# Patient Record
Sex: Female | Born: 1985 | Race: Black or African American | Hispanic: No | Marital: Single | Smoking: Never smoker
Health system: Southern US, Community
[De-identification: ages and names within clinical notes are randomized; demographics above are authoritative.]

## PROBLEM LIST (undated history)

## (undated) ENCOUNTER — Inpatient Hospital Stay (HOSPITAL_COMMUNITY): Payer: Self-pay

## (undated) DIAGNOSIS — N200 Calculus of kidney: Secondary | ICD-10-CM

## (undated) DIAGNOSIS — IMO0002 Reserved for concepts with insufficient information to code with codable children: Secondary | ICD-10-CM

## (undated) DIAGNOSIS — B009 Herpesviral infection, unspecified: Secondary | ICD-10-CM

## (undated) DIAGNOSIS — F419 Anxiety disorder, unspecified: Secondary | ICD-10-CM

---

## 2007-08-20 ENCOUNTER — Ambulatory Visit: Payer: Self-pay | Admitting: Obstetrics and Gynecology

## 2007-08-20 ENCOUNTER — Inpatient Hospital Stay (HOSPITAL_COMMUNITY): Admission: AD | Admit: 2007-08-20 | Discharge: 2007-08-24 | Payer: Self-pay | Admitting: Obstetrics & Gynecology

## 2008-02-20 ENCOUNTER — Ambulatory Visit: Payer: Self-pay | Admitting: Family Medicine

## 2008-05-04 ENCOUNTER — Ambulatory Visit: Payer: Self-pay | Admitting: Family Medicine

## 2009-12-06 ENCOUNTER — Ambulatory Visit: Payer: Self-pay | Admitting: Family Medicine

## 2010-10-16 ENCOUNTER — Inpatient Hospital Stay (HOSPITAL_COMMUNITY)
Admission: AD | Admit: 2010-10-16 | Discharge: 2010-10-16 | Payer: Self-pay | Source: Home / Self Care | Attending: Obstetrics & Gynecology | Admitting: Obstetrics & Gynecology

## 2011-01-17 LAB — URINALYSIS, ROUTINE W REFLEX MICROSCOPIC
Bilirubin Urine: NEGATIVE
Ketones, ur: 15 mg/dL — AB
Nitrite: NEGATIVE
Protein, ur: NEGATIVE mg/dL
pH: 6 (ref 5.0–8.0)

## 2011-01-17 LAB — WET PREP, GENITAL

## 2011-03-14 ENCOUNTER — Other Ambulatory Visit (HOSPITAL_COMMUNITY): Payer: Self-pay

## 2011-03-14 ENCOUNTER — Other Ambulatory Visit (HOSPITAL_COMMUNITY): Payer: Medicaid Other

## 2011-03-15 ENCOUNTER — Inpatient Hospital Stay (HOSPITAL_COMMUNITY)
Admission: AD | Admit: 2011-03-15 | Discharge: 2011-03-17 | DRG: 371 | Disposition: A | Discharge status: Home / Self Care | Payer: Federal, State, Local not specified - PPO | Source: Ambulatory Visit | Attending: Obstetrics and Gynecology | Admitting: Obstetrics and Gynecology

## 2011-03-15 DIAGNOSIS — Z21 Asymptomatic human immunodeficiency virus [HIV] infection status: Secondary | ICD-10-CM | POA: Diagnosis present

## 2011-03-15 DIAGNOSIS — Z22330 Carrier of Group B streptococcus: Secondary | ICD-10-CM

## 2011-03-15 DIAGNOSIS — O34219 Maternal care for unspecified type scar from previous cesarean delivery: Principal | ICD-10-CM | POA: Diagnosis present

## 2011-03-15 DIAGNOSIS — O98519 Other viral diseases complicating pregnancy, unspecified trimester: Secondary | ICD-10-CM | POA: Diagnosis present

## 2011-03-15 DIAGNOSIS — O99892 Other specified diseases and conditions complicating childbirth: Secondary | ICD-10-CM | POA: Diagnosis present

## 2011-03-15 LAB — CBC
HCT: 37.1 % (ref 36.0–46.0)
Hemoglobin: 12.3 g/dL (ref 12.0–15.0)
MCH: 27.9 pg (ref 26.0–34.0)
RBC: 4.41 MIL/uL (ref 3.87–5.11)

## 2011-03-15 LAB — RPR: RPR Ser Ql: NONREACTIVE

## 2011-03-16 LAB — CBC
HCT: 26 % — ABNORMAL LOW (ref 36.0–46.0)
Hemoglobin: 8.5 g/dL — ABNORMAL LOW (ref 12.0–15.0)
MCV: 85.2 fL (ref 78.0–100.0)
WBC: 10.3 10*3/uL (ref 4.0–10.5)

## 2011-03-16 NOTE — Op Note (Signed)
NAME:  Claudia Fitzgerald, Claudia Fitzgerald               ACCOUNT NO.:  [id number]  MEDICAL RECORD NO.:  [id number]           PATIENT TYPE:  I  LOCATION:  9106                          [facility]  PHYSICIAN:  Gerrit Friends. Aldona Bar, M.D.   DATE OF BIRTH:  1985/12/04  DATE OF PROCEDURE:  03/15/2011 DATE OF DISCHARGE:                              OPERATIVE REPORT   PREOPERATIVE DIAGNOSES:  38-39 week intrauterine pregnancy, previous cesarean section, desire for repeat cesarean section, spontaneous rupture of membranes.  POSTOPERATIVE DIAGNOSES:  38-39 week intrauterine pregnancy, previous cesarean section, desire for repeat cesarean section, spontaneous rupture of membranes, delivery of a 6-pound-8-ounce female infant, Apgars 9 and 9.  PROCEDURE:  Low-transverse cesarean section, repeat.  SURGEON:  Gerrit Friends. Aldona Bar, MD  ANESTHESIA:  Spinal, Dr. Arby Barrette.  HISTORY:  This 25 year old, gravida 4, para 1 presented to Maternity Admissions at approximately 6:15 a.m. on Mar 15, 2011, with spontaneous rupture of membranes occurring at approximately 5:45 a.m.  I was called a little after 9 o'clock and made aware of the patient's presence. Examination shortly thereafter found the patient to indeed be ruptured, fetal heart tones were reactive and OR was called and the patient was scheduled for a repeat cesarean section.  The patient's group B strep status was positive.  Two grams of ampicillin IV were given preoperatively.  The patient was taken to the operating room where spinal anesthetic was carried out by Dr. Arby Barrette.  Thereafter, the patient was prepped and draped having placed in supine position and slightly tilted to left and a Foley catheter was placed as part of the prep.  At this time, good anesthetic levels were documented and thereafter procedure was begun.  A Pfannenstiel incision was made with minimal difficulty and dissected down sharply to and through the fascia in a low transverse fashion with  hemostasis created at each layer.  Subfascial space was created inferiorly and superiorly.  Muscle was separated in the midline.  Peritoneum was identified and entered appropriately with care taken to avoid the bowel superiorly and bladder inferiorly.  At this time, the bladder blade was placed and the vesicouterine peritoneum was incised in a low transverse fashion and thereafter the uterus was incised in a low transverse fashion as well using Metzenbaum scissors. A fair amount of bleeding was encountered from vessels in the myometrium.  Nonetheless, the cavity was entered.  Membranes were exposed.  Incision was extended laterally and thereafter with the aid of the vacuum extractor a viable female infant which weighed 6 pounds and 8 ounces was delivered from vertex position.  Apgar were 9 and 9.  After the cord was clamped and cut, the infant was passed off the awaiting team and thereafter taken to the Regular Nursery.  Weight was found to be 6 pounds and 8 ounces.  After the cord bloods was collected, the placenta was delivered intact. The placenta was anterior, but above the incision.  At this time, the uterus was exteriorized, rendered free of any remaining products of conception.  Closure of the uterine incision was then carried out using a single layer of #1 Vicryl in a  running locking fashion.  This created excellent hemostasis.  Tubes and ovaries were noted to be normal. Uterus was well contracted.  Again, the uterine incision was inspected and noted to be dry.  At this time, abdomen was lavaged of all free blood and clot, and the uterus was replaced into the abdominal cavity.  All counts were noted to be correct at this time.  There were some omental adhesion stuck to the anterior peritoneum on the patient's right and these were taken down sharply without difficulty.  Uterine incision was again inspected and noted to be dry and at this time closure of the abdomen was carried  out in layers.  The abdominal peritoneum was closed with 0 Vicryl in a running fashion and muscle was secured with same.  Subfascial space was rendered hemostatic and the fascia was then closed with 0 Vicryl from angle to midline bilaterally.  Subcutaneous tissue was rendered hemostatic and staples were then used to close the skin.  A sterile pressure dressing was applied and the patient at this time was transported to the recovery room in satisfactory condition having tolerated the procedure well.  Estimated blood loss 600 mL.  All counts were correct x2.  In summary, this patient presented to Maternity Admissions with spontaneous rupture of membranes.  She was 38-39 weeks' gestation having had a previous cesarean section and desires repeat.  She was scheduled for next week, but because of ruptured membranes and because of her group B strep status she received a dose of 2 g of ampicillin preop and was taken to the operating room for repeat low transverse cesarean section with delivery of a viable female infant weighing 6 pounds and 8 ounces and Apgars 9 and 9.  At the conclusion of the procedure, both mother and baby were doing well in their respective recovery areas.     Gerrit Friends. Aldona Bar, M.D.     RMW/MEDQ  D:  03/15/2011  T:  03/16/2011  Job:  [id number]  Electronically Signed by Annamaria Helling M.D. on 03/16/2011 05:36:56 AM

## 2011-03-21 NOTE — Op Note (Signed)
NAME:  Fitzgerald, Claudia               ACCOUNT NO.:  [id number]   MEDICAL RECORD NO.:  [id number]          PATIENT TYPE:  INP   LOCATION:  9167                          [facility]   PHYSICIAN:  Ilda Mori, M.D.   DATE OF BIRTH:  1986/06/21   DATE OF PROCEDURE:  08/21/2007  DATE OF DISCHARGE:                               OPERATIVE REPORT   PREOPERATIVE DIAGNOSIS:  Second stage arrest, cephalopelvic  disproportion.   POSTOPERATIVE DIAGNOSIS:  Second stage arrest, cephalopelvic  disproportion.   PROCEDURE:  Primary low transverse cesarean section.   SURGEON:  Dr. Ilda Mori.   ASSISTANT:  Dr. Christin Bach.   ANESTHESIA:  Epidural.   ESTIMATED BLOOD LOSS:  800 mL.   FINDINGS:  Female infant, Apgar scores 9 and 9, birth weight 8 pounds 7  ounces.  Normal-appearing tubes and ovaries.   COMPLICATIONS:  None.   INDICATIONS:  This is a 25 year old primigravid female who was admitted  on the evening of 10/14 for prostaglandin ripening of the cervix and  started on Pitocin the morning of 10/15 for induction of labor.  The  patient was 59 weeks' gestation with an estimated fetal weight between 8-  1/2 and 9 pounds.  The patient's pelvis appeared adequate but she was  only 4 feet 8 inches tall.  The patient proceeded through the first  stage of labor rapidly and was completely dilated by 1330.  She was  allowed to labor down until 1410 and pushed until 1610.  At that point  the vertex had not made significant descent.  It was still at 0 to +1  station.  Because of concerns over the size of the baby and the possible  inadequacy of the pelvis, decision was made not to proceed with  operative delivery but to proceed with cesarean section.  The patient  was in concurrence with this recommendation.   PROCEDURE:  The patient is taken to the operating room and the epidural  anesthesia that had been placed during labor was injected for surgical  anesthesia.  The bladder had been  catheterized previously.  The abdomen  was prepped and draped in sterile fashion.  A low transverse incision  was made and carried down to the fascia which was extended transversely.  The anterior rectus sheath was grasped with Kocher clamps dissected from  the underlying rectus muscle.  The rectus midline was identified and  opened the peritoneum was identified and opened.  The incision was then  extended bluntly.  The lower segment was identified.  Incision was made  in the lower segment and extended bluntly.  The infant's head was deep  in the pelvis but was delivered without difficulty.  The cord bloods  were obtained and the placenta was then delivered with traction on the  cord.  The membranes were cleared with blunt curettage. The lower  segment closed with a 0 Vicryl running interlocking suture.  A second  imbricating layer of 0 Vicryl suture was also performed.  Hemostasis was  then noted to be present.  The peritoneum was closed with  running 0 Vicryl  and 3-0 Vicryl suture and the rectus muscle was brought  together in the midline with 3-0 Vicryl suture.  The fascia was closed  with 0 Vicryl suture and the skin was closed with staples.  The patient  tolerated procedure well and left the operating room condition.      Ilda Mori, M.D.  Electronically Signed     RK/MEDQ  D:  08/21/2007  T:  08/22/2007  Job:  [id number]

## 2011-03-22 ENCOUNTER — Inpatient Hospital Stay (HOSPITAL_COMMUNITY)
Admission: RE | Admit: 2011-03-22 | Payer: Federal, State, Local not specified - PPO | Source: Ambulatory Visit | Admitting: Obstetrics and Gynecology

## 2011-03-24 NOTE — Discharge Summary (Signed)
NAME:  Claudia Fitzgerald, Claudia Fitzgerald               ACCOUNT NO.:  [id number]   MEDICAL RECORD NO.:  [id number]          PATIENT TYPE:  INP   LOCATION:  9107                          [facility]   PHYSICIAN:  Kendra H. Tenny Craw, MD     DATE OF BIRTH:  01-Nov-1986   DATE OF ADMISSION:  08/20/2007  DATE OF DISCHARGE:  08/24/2007                               DISCHARGE SUMMARY   FINAL DIAGNOSES:  1. Intrauterine gestation at 40-6/7 weeks.  2. Induction of labor.  3. 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  4. Arrest of the second stage of labor.  5. Cephalopelvic disproportion.   PROCEDURE:  Primary low transverse cesarean section.  Surgeon:  Teena Irani.  Arlyce Dice, M.D.  Assistant:  Tilda Burrow, M.D.   COMPLICATIONS:  None.   This 25 year old G1, P0, was admitted at 40-6/7 weeks' gestation for  induction secondary to post dates pregnancy.  The patient's antepartum  course up to this point has been complicated by a history of HSV.  She  did have a recent outbreak of HSV but no lesions present at this time.  The patient was admitted on the evening of the 14th for Cytotec and was  started on Pitocin on the morning of the 15th to augment labor.  The  patient proceeded through the first stage of labor rapidly.  At this  point the patient pushed for over 2 hours and the vertex did not make  any significant descent.  Because of the possible size of the baby and  the inadequacy of the pelvis, a diagnosis of cephalopelvic disproportion  was made and a decision was made to proceed with a cesarean section.  The patient was taken to the operating room on August 21, 2007, by Dr.  Ilda Mori, where a primary low transverse cesarean section was  performed with the delivery of an 8 pound 7 ounce female infant with  Apgars of 9 and 9.  Delivery went without complications.  The patient's  postoperative course was benign without any significant fevers.  The  patient was felt ready  for discharge on postoperative day #3.  She did  want her little boy circumcised before discharge.  She was sent home on  a regular diet, told to decrease activities, told to continue her  prenatal vitamins, was given Percocet one to two every 4-6 hours as  needed for the pain, was to follow up in our office in 4-6 weeks.  Instructions and precautions were reviewed with the patient.  Labs on  discharge:  The patient had a hemoglobin of 11.8, white blood cell count  of 11.7, platelets of 137,000.      Leilani Able, P.A.-C.      Freddrick March. Tenny Craw, MD  Electronically Signed   MB/MEDQ  D:  09/26/2007  T:  09/26/2007  Job:  [id number]

## 2011-03-25 NOTE — Discharge Summary (Signed)
  NAME:  Claudia Fitzgerald, Claudia Fitzgerald               ACCOUNT NO.:  [id number]  MEDICAL RECORD NO.:  [id number]           PATIENT TYPE:  I  LOCATION:  9106                          [facility]  PHYSICIAN:  Malva Limes, M.D.    DATE OF BIRTH:  05-18-1986  DATE OF ADMISSION:  03/15/2011 DATE OF DISCHARGE:  03/17/2011                              DISCHARGE SUMMARY   PRINCIPAL DISCHARGE DIAGNOSES: 1. Intrauterine pregnancy at term. 2. Spontaneous rupture of membranes. 3. History of previous cesarean section. 4. The patient desires repeat cesarean section. 5. History of herpes simplex virus. 6. Positive group B strep status.  PRICIPAL PROCEDURE:  Repeat low transverse cesarean section.  HISTORY OF PRESENT ILLNESS:  Ms. Claudia Fitzgerald is a 25 year old black female, G4, [facility], [facility] Mar 26, 2010 who presented to [facility] complaining of ruptured membranes.  The patient was seen by Dr. Aldona Bar. She had previously had a discussion about repeat cesarean section and desired that procedure.  The patient underwent repeat cesarean section. A complete description of this can be found in dictated operative note. The patient's postop course was benign at the time of discharge.  She was eating a regular diet.  She was ambulating without difficulty.  She did have flatus.  Her incision appeared to be healing well.  The patient's preop hemoglobin was 12.3, postop 8.5.  The patient expressed a strong desire to be discharged on postop day #2.  She was discharged to home.  She was instructed to follow up in the office in 4 weeks.  She was sent home with Percocet to take p.r.n.  She was told to call the office with any fever, severe pain, or heavy bleeding.          ______________________________ Malva Limes, M.D.     MA/MEDQ  D:  03/17/2011  T:  03/17/2011  Job:  [id number]  Electronically Signed by Malva Limes M.D. on 03/25/2011 04:00:09 PM

## 2011-08-16 LAB — CBC
Hemoglobin: 11.8 — ABNORMAL LOW
MCHC: 33.2
MCV: 87.6
RBC: 4.06
RDW: 15.1 — ABNORMAL HIGH

## 2011-08-17 LAB — CBC
MCHC: 34.5
MCV: 85.7
Platelets: 158
RDW: 15.2 — ABNORMAL HIGH

## 2011-08-17 LAB — RPR: RPR Ser Ql: NONREACTIVE

## 2012-01-30 ENCOUNTER — Encounter (HOSPITAL_COMMUNITY): Payer: Self-pay | Admitting: *Deleted

## 2012-01-30 ENCOUNTER — Emergency Department (HOSPITAL_COMMUNITY)
Admission: EM | Admit: 2012-01-30 | Discharge: 2012-01-30 | Disposition: A | Discharge status: Home / Self Care | Payer: Federal, State, Local not specified - PPO | Attending: Emergency Medicine | Admitting: Emergency Medicine

## 2012-01-30 ENCOUNTER — Emergency Department (HOSPITAL_COMMUNITY): Payer: Federal, State, Local not specified - PPO

## 2012-01-30 ENCOUNTER — Other Ambulatory Visit: Payer: Self-pay

## 2012-01-30 DIAGNOSIS — R079 Chest pain, unspecified: Secondary | ICD-10-CM | POA: Insufficient documentation

## 2012-01-30 DIAGNOSIS — F411 Generalized anxiety disorder: Secondary | ICD-10-CM | POA: Insufficient documentation

## 2012-01-30 HISTORY — DX: Anxiety disorder, unspecified: F41.9

## 2012-01-30 LAB — CBC
MCHC: 34.6 g/dL (ref 30.0–36.0)
Platelets: 244 10*3/uL (ref 150–400)
RDW: 13.3 % (ref 11.5–15.5)

## 2012-01-30 LAB — BASIC METABOLIC PANEL
BUN: 11 mg/dL (ref 6–23)
GFR calc Af Amer: >90 mL/min (ref >90)
GFR calc non Af Amer: >90 mL/min (ref >90)
Potassium: 3.1 mEq/L — ABNORMAL LOW (ref 3.5–5.1)
Sodium: 137 mEq/L (ref 135–145)

## 2012-01-30 LAB — TROPONIN I: Troponin I: <0.3 ng/mL (ref <0.30)

## 2012-01-30 LAB — D-DIMER, QUANTITATIVE: D-Dimer, Quant: <0.22 ug/mL-FEU (ref 0.00–0.48)

## 2012-01-30 MED ORDER — OXYCODONE-ACETAMINOPHEN 5-325 MG PO TABS
1.0000 | ORAL_TABLET | Freq: Once | ORAL | Status: AC
  Administered 2012-01-30: 1 via ORAL
  Filled 2012-01-30: qty 1

## 2012-01-30 MED ORDER — POTASSIUM CHLORIDE 20 MEQ/15ML (10%) PO LIQD
40.0000 meq | Freq: Once | ORAL | Status: AC
  Administered 2012-01-30: 40 meq via ORAL
  Filled 2012-01-30: qty 30

## 2012-01-30 MED ORDER — IBUPROFEN 600 MG PO TABS: 600.0000 mg | ORAL_TABLET | Freq: Four times a day (QID) | ORAL | Status: AC | PRN

## 2012-01-30 NOTE — ED Provider Notes (Signed)
History     CSN: [id number]  Arrival date & time 01/30/12  0122   First MD Initiated Contact with Patient 01/30/12 [phone]      Chief Complaint  Patient presents with  . Chest Pain    (Consider location/radiation/quality/duration/timing/severity/associated sxs/prior treatment) HPI  25yoF h/o anxiety pw chest pain. She is complaining of 6/10 substernal chest pain. She describes it as sharp and radiating to her back. Pleurisy. Denies fevers, chills, cough. She states the pain has been constant since 2 days ago. She states that she did have shortness of breath, palpitations, syncope at that time. She was seen at an outside emergency department and diagnosed with anxiety. She states that it does seem different from her normal anxiety. Denies h/o VTE in self or family. No recent hosp/surg/immob. No h/o cancer. Denies exogenous hormone use, no leg pain or swelling   ED Notes, ED Provider Notes from 01/30/12 0000 to 01/30/12 02:36:26       Threasa Alpha Flueckiger, RN 01/30/2012 01:31      Pt has been having CP since yesterday. She was seen at [facility] yesterday CP and anxiety attack. Pt states that she was discharged home but continues to have stabbing type chest pain in her mid chest, radiating to her back. No sob, n/v or diaphoresis with this. Pt denies any anxiety at this time and appears calm in triage      Past Medical History  Diagnosis Date  . Anxiety     Past Surgical History  Procedure Date  . Cesarean section 2008 and 2012    No family history on file.  History  Substance Use Topics  . Smoking status: Never Smoker   . Smokeless tobacco: Not on file  . Alcohol Use: Yes    OB History    Grav Para Term Preterm Abortions TAB SAB Ect Mult Living                  Review of Systems  All other systems reviewed and are negative.   except as noted HPI   Allergies  Review of patient's allergies indicates no known allergies.  Home Medications   Current  Outpatient Rx  Name Route Sig Dispense Refill  . ASPIRIN 325 MG PO TABS Oral Take 650 mg by mouth daily as needed. For pain.    . IBUPROFEN 600 MG PO TABS Oral Take 1 tablet (600 mg total) by mouth every 6 (six) hours as needed for pain. 30 tablet 0    BP 116/91  Pulse 78  Temp(Src) 97.9 F (36.6 C) (Oral)  Resp 20  SpO2 100%  LMP 01/16/2012  Physical Exam  Nursing note and vitals reviewed. Constitutional: She is oriented to person, place, and time. She appears well-developed.  HENT:  Head: Atraumatic.  Mouth/Throat: Oropharynx is clear and moist.  Eyes: Conjunctivae and EOM are normal. Pupils are equal, round, and reactive to light.  Neck: Normal range of motion. Neck supple.  Cardiovascular: Normal rate, regular rhythm, normal heart sounds and intact distal pulses.   Pulmonary/Chest: Effort normal and breath sounds normal. No respiratory distress. She has no wheezes. She has no rales. She exhibits tenderness.       +cw ttp- states does not reproduce her pain  Abdominal: Soft. She exhibits no distension. There is no tenderness. There is no rebound and no guarding.  Musculoskeletal: Normal range of motion.  Neurological: She is alert and oriented to person, place, and time.  Skin: Skin is warm and  dry. No rash noted.  Psychiatric: She has a normal mood and affect.    Date: 01/30/2012  Rate: 90  Rhythm: normal sinus rhythm  QRS Axis: normal  Intervals: normal  ST/T Wave abnormalities: normal  Conduction Disutrbances:none  Narrative Interpretation:   Old EKG Reviewed: none available    ED Course  Procedures (including critical care time)  Labs Reviewed  BASIC METABOLIC PANEL - Abnormal; Notable for the following:    Potassium 3.1 (*)    All other components within normal limits  CBC  TROPONIN I  D-DIMER, QUANTITATIVE   Dg Chest 2 View  01/30/2012  *RADIOLOGY REPORT*  Clinical Data: Mid chest pain.  CHEST - 2 VIEW  Comparison: Chest radiograph performed 01/28/2012   Findings: The lungs are well-aerated and clear.  There is no evidence of focal opacification, pleural effusion or pneumothorax.  The heart is normal in size; the mediastinal contour is within normal limits.  No acute osseous abnormalities are seen.  IMPRESSION: No acute cardiopulmonary process seen.  Original Report Authenticated By: Tonia Ghent, M.D.    1. Chest pain     MDM  PW chest pain, likely 2/2 msk. Anxiety may play a role. EKG, labs unremarkable including d dimer. Feeling better with percocet x 1. Will discharge home with ibuprofen, PMD f/u.        Forbes Cellar, MD 01/30/12 [phone]

## 2012-01-30 NOTE — ED Notes (Signed)
Pt states that she is having a constant sharp pain that radiates through her back and gets worse with a deep breath. Pt denies N/V/D, SOB,or diaphoresis. Pt alert and oriented able to follow commands and move extremities.

## 2012-01-30 NOTE — Discharge Instructions (Signed)
Chest Pain (Nonspecific) It is often hard to give a specific diagnosis for the cause of chest pain. There is always a chance that your pain could be related to something serious, such as a heart attack or a blood clot in the lungs. You need to follow up with your caregiver for further evaluation. CAUSES   Heartburn.   Pneumonia or bronchitis.   Anxiety or stress.   Inflammation around your heart (pericarditis) or lung (pleuritis or pleurisy).   A blood clot in the lung.   A collapsed lung (pneumothorax). It can develop suddenly on its own (spontaneous pneumothorax) or from injury (trauma) to the chest.   Shingles infection (herpes zoster virus).  The chest wall is composed of bones, muscles, and cartilage. Any of these can be the source of the pain.  The bones can be bruised by injury.   The muscles or cartilage can be strained by coughing or overwork.   The cartilage can be affected by inflammation and become sore (costochondritis).  DIAGNOSIS  Lab tests or other studies, such as X-rays, electrocardiography, stress testing, or cardiac imaging, may be needed to find the cause of your pain.  TREATMENT   Treatment depends on what may be causing your chest pain. Treatment may include:   Acid blockers for heartburn.   Anti-inflammatory medicine.   Pain medicine for inflammatory conditions.   Antibiotics if an infection is present.   You may be advised to change lifestyle habits. This includes stopping smoking and avoiding alcohol, caffeine, and chocolate.   You may be advised to keep your head raised (elevated) when sleeping. This reduces the chance of acid going backward from your stomach into your esophagus.   Most of the time, nonspecific chest pain will improve within 2 to 3 days with rest and mild pain medicine.  HOME CARE INSTRUCTIONS   If antibiotics were prescribed, take your antibiotics as directed. Finish them even if you start to feel better.   For the next few  days, avoid physical activities that bring on chest pain. Continue physical activities as directed.   Do not smoke.   Avoid drinking alcohol.   Only take over-the-counter or prescription medicine for pain, discomfort, or fever as directed by your caregiver.   Follow your caregiver's suggestions for further testing if your chest pain does not go away.   Keep any follow-up appointments you made. If you do not go to an appointment, you could develop lasting (chronic) problems with pain. If there is any problem keeping an appointment, you must call to reschedule.  SEEK MEDICAL CARE IF:   You think you are having problems from the medicine you are taking. Read your medicine instructions carefully.   Your chest pain does not go away, even after treatment.   You develop a rash with blisters on your chest.  SEEK IMMEDIATE MEDICAL CARE IF:   You have increased chest pain or pain that spreads to your arm, neck, jaw, back, or abdomen.   You develop shortness of breath, an increasing cough, or you are coughing up blood.   You have severe back or abdominal pain, feel nauseous, or vomit.   You develop severe weakness, fainting, or chills.   You have a fever.  THIS IS AN EMERGENCY. Do not wait to see if the pain will go away. Get medical help at once. Call your local emergency services (911 in [place] Do not drive yourself to the hospital. MAKE SURE YOU:   Understand these instructions.     Will watch your condition.   Will get help right away if you are not doing well or get worse.  Document Released: 08/02/2005 Document Revised: 10/12/2011 Document Reviewed: 05/28/2008 [facility] Patient Information 2012 [place], [place].  RESOURCE GUIDE  Dental Problems  Patients with Medicaid: [facility] [phone] [address]                                           [phone] [address] Phone:  [phone]                                                    Phone:  [phone]  If unable to pay or uninsured, contact:  [facility] or [facility]. to become qualified for the adult dental clinic.  Chronic Pain Problems Contact [facility]  [phone] Patients need to be referred by their primary care doctor.  Insufficient Money for Medicine Contact United Way:  call "211" or [facility] [phone].  No Primary Care Doctor Call [facility]  4038064432 Other agencies that provide inexpensive medical care    [facility]  [phone]    [facility]  [phone]    [facility]  [phone]    [facility]  [phone]    [facility]  [phone]    [facility]  [phone]  Psychological Services [facility]  [phone] [facility]  [phone] [facility]   [phone] (emergency services [phone])  Abuse/Neglect [facility] Child Abuse Hotline ([phone] [facility] Child Abuse Hotline 858-416-7057 (After Hours)  Emergency Shelter [facility] (863)691-6708  M[facility] [place] [facility] [phone])[phone] [facility] [phone]  MRSA Hotline #:   567-253-3728    [facility] Resources  [facility]  United [employer]                           [facility] Dept. [address] [place]                     [address]         [address]  [place]                                               Cristobal Goldmann Phone:  [phone]                                  Phone:  [phone]                   Phone:  [phone]  [facility] Phone:  [phone]  [facility] Child Abuse Hotline 747-727-2056 [phone]) [phone] (After Hours)

## 2012-01-30 NOTE — ED Notes (Signed)
Pt has been having CP since yesterday.  She was seen at [facility] yesterday CP and anxiety attack.  Pt states that she was discharged home but continues to have stabbing type chest pain in her mid chest, radiating to her back.  No sob, n/v or diaphoresis with this.  Pt denies any anxiety at this time and appears calm in triage

## 2012-11-19 LAB — OB RESULTS CONSOLE GC/CHLAMYDIA
Chlamydia: NEGATIVE
Gonorrhea: NEGATIVE

## 2013-01-22 LAB — OB RESULTS CONSOLE ABO/RH: RH Type: POSITIVE

## 2013-04-06 ENCOUNTER — Inpatient Hospital Stay (HOSPITAL_COMMUNITY)
Admission: AD | Admit: 2013-04-06 | Discharge: 2013-04-06 | Disposition: A | Discharge status: Home / Self Care | Payer: Federal, State, Local not specified - PPO | Source: Ambulatory Visit | Attending: Obstetrics and Gynecology | Admitting: Obstetrics and Gynecology

## 2013-04-06 ENCOUNTER — Inpatient Hospital Stay (HOSPITAL_COMMUNITY): Payer: Federal, State, Local not specified - PPO

## 2013-04-06 ENCOUNTER — Encounter (HOSPITAL_COMMUNITY): Payer: Self-pay | Admitting: *Deleted

## 2013-04-06 DIAGNOSIS — R109 Unspecified abdominal pain: Secondary | ICD-10-CM | POA: Insufficient documentation

## 2013-04-06 DIAGNOSIS — N949 Unspecified condition associated with female genital organs and menstrual cycle: Secondary | ICD-10-CM

## 2013-04-06 DIAGNOSIS — O34219 Maternal care for unspecified type scar from previous cesarean delivery: Secondary | ICD-10-CM | POA: Insufficient documentation

## 2013-04-06 DIAGNOSIS — O99891 Other specified diseases and conditions complicating pregnancy: Secondary | ICD-10-CM | POA: Insufficient documentation

## 2013-04-06 HISTORY — DX: Herpesviral infection, unspecified: B00.9

## 2013-04-06 LAB — URINALYSIS, ROUTINE W REFLEX MICROSCOPIC
Glucose, UA: 100 mg/dL — AB
Ketones, ur: NEGATIVE mg/dL
Leukocytes, UA: NEGATIVE
Protein, ur: NEGATIVE mg/dL

## 2013-04-06 MED ORDER — PRENATAL CA CARB-B6-B12-FA 1 MG PO TABS: 1.0000 | ORAL_TABLET | Freq: Every day | ORAL | Status: DC

## 2013-04-06 NOTE — MAU Note (Signed)
Saw Dr Dareen Piano on Thursday, having pain in incision site. Was told to call back on Fri, didn't but came in because the pain has become unbearable. Was told they may need to do an [place].

## 2013-04-06 NOTE — MAU Provider Note (Signed)
None     Chief Complaint:  Abdominal Pain   Claudia Fitzgerald is  27 y.o. [id number] at [redacted]w[redacted]d presents complaining of Abdominal Pain .  Pt  C/o lower abdominal pain when she moves, changes position, or the baby moves.  She states that it feels like it is her c/s scar, but the pain extends along the sides of her lower abdomen.    States that she has been having this pain since last week, and that the nurse at her OB's office said that Dr. Dareen Piano may want her to come to the MAU for an ultrasound.    Also states that Dr. Dareen Piano told her she could probably have her c/s June 25 instead of July 1 because of this pain.     Obstetrical/Gynecological History: Menstrual History: OB History   Grav Para Term Preterm Abortions TAB SAB Ect Mult Living   4 2 1 1 1 1  0 0 0 2       Past Medical History: Past Medical History  Diagnosis Date  . Anxiety   . Herpes     Past Surgical History: Past Surgical History  Procedure Laterality Date  . Cesarean section  2008 and 2012    Family History: History reviewed. No pertinent family history.  Social History: History  Substance Use Topics  . Smoking status: Never Smoker   . Smokeless tobacco: Not on file  . Alcohol Use: Yes    Allergies: No Known Allergies  Meds:  Prescriptions prior to admission  Medication Sig Dispense Refill  . valACYclovir (VALTREX) 500 MG tablet Take 500 mg by mouth 2 (two) times daily.        Review of Systems   Constitutional: Negative for fever and chills Eyes: Negative for visual disturbances Respiratory: Negative for shortness of breath, dyspnea Cardiovascular: Negative for chest pain or palpitations  Gastrointestinal: Negative for vomiting, diarrhea and constipation Genitourinary: Negative for dysuria and urgency Musculoskeletal: Negative for back pain, joint pain, myalgias  Neurological: Negative for dizziness and headaches     Physical Exam  Blood pressure 128/67, pulse 89, temperature 96.7 F  (35.9 C), temperature source Oral, resp. rate 18. GENERAL: Well-developed, well-nourished female in no acute distress.  LUNGS: Clear to auscultation bilaterally.  HEART: Regular rate and rhythm. ABDOMEN: Soft, nontender, nondistended, gravid.  EXTREMITIES: Nontender, no edema, 2+ distal pulses. CERVICAL EXAM: Dilatation 0cm   Effacement 0%   Station -3   Presentation: cephalic FHT:  Baseline rate 140 bpm   Variability moderate  Accelerations present   Decelerations none Contractions: Every 0 mins  Dr. Dareen Piano contacted and ordered an U/S to r/o separation of c/s scar.  He stated that C/S would not be done before 39 weeks if pregnancy remained normal.   Labs: Results for orders placed during the hospital encounter of 04/06/13 (from the past 24 hour(s))  URINALYSIS, ROUTINE W REFLEX MICROSCOPIC   Collection Time    04/06/13  1:49 PM      Result Value Range   Color, Urine YELLOW  YELLOW   APPearance CLEAR  CLEAR   Specific Gravity, Urine 1.010  1.005 - 1.030   pH 6.0  5.0 - 8.0   Glucose, UA 100 (*) NEGATIVE mg/dL   Hgb urine dipstick NEGATIVE  NEGATIVE   Bilirubin Urine NEGATIVE  NEGATIVE   Ketones, ur NEGATIVE  NEGATIVE mg/dL   Protein, ur NEGATIVE  NEGATIVE mg/dL   Urobilinogen, UA 0.2  0.0 - 1.0 mg/dL   Nitrite NEGATIVE  NEGATIVE  Leukocytes, UA NEGATIVE  NEGATIVE   Imaging Studies:  *RADIOLOGY REPORT*   Clinical Data:  Pain at location of cesarean section.  Rule out "separation."  34 weeks 5 days pregnant by last menstrual period.   LIMITED OBSTETRIC ULTRASOUND   Number of Fetuses: 1 Heart Rate: 143bpm Movement:  Present Presentation: Cephalic Placental Location: Anterior Previa: No Amniotic Fluid (Subjective): Normal Vertical pocket:  cm   AFI: 16.4cm (5%ile  6.9cm, 95%ile  24.9cm)   BPD:  8.4cm   34w    0d   MATERNAL FINDINGS: Cervix:  Not visualized Uterus/Adnexae:  Within normal limits.   IMPRESSION: Intrauterine pregnancy of 34 weeks zero days' with  fetal heart rate of 143 beats per minute.   No acute complication, including at the C section scar identified.     Original Report Authenticated By: Jeronimo Greaves, M.D.    Assessment: Claudia Fitzgerald is  27 y.o. [phone] at [redacted]w[redacted]d presents with round ligament pain.  Plan: Comfort measures discussed.    Fitzgerald,Claudia Harriman 6/1/20144:18 PM

## 2013-04-17 ENCOUNTER — Other Ambulatory Visit: Payer: Self-pay | Admitting: Obstetrics and Gynecology

## 2013-04-28 ENCOUNTER — Encounter (HOSPITAL_COMMUNITY): Payer: Self-pay | Admitting: *Deleted

## 2013-04-28 ENCOUNTER — Inpatient Hospital Stay (HOSPITAL_COMMUNITY)
Admission: AD | Admit: 2013-04-28 | Discharge: 2013-04-28 | Disposition: A | Discharge status: Home / Self Care | Payer: Federal, State, Local not specified - PPO | Source: Ambulatory Visit | Attending: Obstetrics and Gynecology | Admitting: Obstetrics and Gynecology

## 2013-04-28 DIAGNOSIS — O479 False labor, unspecified: Secondary | ICD-10-CM | POA: Diagnosis present

## 2013-04-28 DIAGNOSIS — O34219 Maternal care for unspecified type scar from previous cesarean delivery: Secondary | ICD-10-CM | POA: Insufficient documentation

## 2013-04-28 NOTE — MAU Note (Signed)
Patient states she had a small amount of leaking clear fluid at 0730, none since. Having irregular contractions. Reports good fetal movement.

## 2013-04-28 NOTE — MAU Note (Signed)
Patient is scheduled for a repeat cesarean section on 7-3.

## 2013-05-02 ENCOUNTER — Other Ambulatory Visit: Payer: Self-pay | Admitting: Obstetrics and Gynecology

## 2013-05-05 NOTE — Pre-Procedure Instructions (Signed)
Left message with stacey or scheduler at office to have [facility] preop orders for PAT appt tomorrow

## 2013-05-06 ENCOUNTER — Encounter (HOSPITAL_COMMUNITY)
Admission: RE | Admit: 2013-05-06 | Discharge: 2013-05-06 | Disposition: A | Discharge status: Home / Self Care | Payer: Federal, State, Local not specified - PPO | Source: Ambulatory Visit | Attending: Obstetrics and Gynecology | Admitting: Obstetrics and Gynecology

## 2013-05-06 ENCOUNTER — Encounter (HOSPITAL_COMMUNITY): Payer: Self-pay

## 2013-05-06 HISTORY — DX: Reserved for concepts with insufficient information to code with codable children: IMO0002

## 2013-05-06 LAB — CBC
Hemoglobin: 10.6 g/dL — ABNORMAL LOW (ref 12.0–15.0)
MCH: 25.4 pg — ABNORMAL LOW (ref 26.0–34.0)
MCHC: 32.6 g/dL (ref 30.0–36.0)
Platelets: 138 10*3/uL — ABNORMAL LOW (ref 150–400)
RBC: 4.18 MIL/uL (ref 3.87–5.11)

## 2013-05-06 LAB — TYPE AND SCREEN: ABO/RH(D): O POS

## 2013-05-06 LAB — RPR: RPR Ser Ql: NONREACTIVE

## 2013-05-06 LAB — ABO/RH: ABO/RH(D): O POS

## 2013-05-06 NOTE — Pre-Procedure Instructions (Signed)
Informed Dr Rodman Pickle of low platelets 138.  Verbal order to recheck platelet count Stat on DOS 05/08/13.  Verified order.

## 2013-05-06 NOTE — Patient Instructions (Addendum)
   Your procedure is scheduled on:  Thursday, July 3   Enter through the [employer] of [facility] at:  6 am Pick up the phone at the desk and dial [phone] and inform us of your arrival.  Please call this number if you have any problems the morning of surgery: [phone]  Remember: Do not eat or drink after midnight: Wednesday Take these medicines the morning of surgery with a SIP OF WATER: valtrex  Do not wear jewelry, make-up, or FINGER nail polish No metal in your hair or on your body. Do not wear lotions, powders, perfumes. You may wear deodorant.  Please use your CHG wash as directed prior to surgery.  Do not shave anywhere for at least 12 hours prior to first CHG shower.  Do not bring valuables to the hospital. Contacts, dentures or bridgework may not be worn into surgery.  Leave suitcase in the car. After Surgery it may be brought to your room. For patients being admitted to the hospital, checkout time is 11:00am the day of discharge.  Home with fiance Keon.

## 2013-05-08 ENCOUNTER — Encounter (HOSPITAL_COMMUNITY): Payer: Self-pay | Admitting: Anesthesiology

## 2013-05-08 ENCOUNTER — Inpatient Hospital Stay (HOSPITAL_COMMUNITY)
Admission: RE | Admit: 2013-05-08 | Discharge: 2013-05-11 | DRG: 371 | Disposition: A | Discharge status: Home / Self Care | Payer: Federal, State, Local not specified - PPO | Source: Ambulatory Visit | Attending: Obstetrics and Gynecology | Admitting: Obstetrics and Gynecology

## 2013-05-08 ENCOUNTER — Encounter (HOSPITAL_COMMUNITY)
Admission: RE | Disposition: A | Discharge status: Home / Self Care | Payer: Self-pay | Source: Ambulatory Visit | Attending: Obstetrics and Gynecology

## 2013-05-08 ENCOUNTER — Inpatient Hospital Stay (HOSPITAL_COMMUNITY): Payer: Federal, State, Local not specified - PPO | Admitting: Anesthesiology

## 2013-05-08 DIAGNOSIS — O34219 Maternal care for unspecified type scar from previous cesarean delivery: Principal | ICD-10-CM | POA: Diagnosis present

## 2013-05-08 DIAGNOSIS — Z302 Encounter for sterilization: Secondary | ICD-10-CM

## 2013-05-08 LAB — BIRTH TISSUE RECOVERY COLLECTION (PLACENTA DONATION)

## 2013-05-08 SURGERY — Surgical Case
Anesthesia: Spinal | Site: Abdomen | Wound class: Clean Contaminated

## 2013-05-08 MED ORDER — KETOROLAC TROMETHAMINE 30 MG/ML IJ SOLN: 30.0000 mg | Freq: Four times a day (QID) | INTRAMUSCULAR | Status: AC | PRN

## 2013-05-08 MED ORDER — SENNOSIDES-DOCUSATE SODIUM 8.6-50 MG PO TABS
2.0000 | ORAL_TABLET | Freq: Every day | ORAL | Status: DC
  Administered 2013-05-08 – 2013-05-10 (×3): 2 via ORAL

## 2013-05-08 MED ORDER — LANOLIN HYDROUS EX OINT: 1.0000 "application " | TOPICAL_OINTMENT | CUTANEOUS | Status: DC | PRN

## 2013-05-08 MED ORDER — ONDANSETRON HCL 4 MG/2ML IJ SOLN: 4.0000 mg | Freq: Three times a day (TID) | INTRAMUSCULAR | Status: DC | PRN

## 2013-05-08 MED ORDER — OXYCODONE-ACETAMINOPHEN 5-325 MG PO TABS
1.0000 | ORAL_TABLET | ORAL | Status: DC | PRN
  Administered 2013-05-08: 1 via ORAL
  Administered 2013-05-09: 2 via ORAL
  Administered 2013-05-09 (×4): 1 via ORAL
  Administered 2013-05-10 (×2): 2 via ORAL
  Administered 2013-05-10 – 2013-05-11 (×4): 1 via ORAL
  Administered 2013-05-11: 2 via ORAL
  Filled 2013-05-08 (×2): qty 2
  Filled 2013-05-08 (×6): qty 1
  Filled 2013-05-08: qty 2
  Filled 2013-05-08 (×3): qty 1
  Filled 2013-05-08: qty 2
  Filled 2013-05-08: qty 1

## 2013-05-08 MED ORDER — DIBUCAINE 1 % RE OINT: 1.0000 "application " | TOPICAL_OINTMENT | RECTAL | Status: DC | PRN

## 2013-05-08 MED ORDER — ONDANSETRON HCL 4 MG PO TABS: 4.0000 mg | ORAL_TABLET | ORAL | Status: DC | PRN

## 2013-05-08 MED ORDER — NALOXONE HCL 0.4 MG/ML IJ SOLN: 0.4000 mg | INTRAMUSCULAR | Status: DC | PRN

## 2013-05-08 MED ORDER — ONDANSETRON HCL 4 MG/2ML IJ SOLN
INTRAMUSCULAR | Status: DC | PRN
  Administered 2013-05-08: 4 mg via INTRAVENOUS

## 2013-05-08 MED ORDER — PHENYLEPHRINE 40 MCG/ML (10ML) SYRINGE FOR IV PUSH (FOR BLOOD PRESSURE SUPPORT)
PREFILLED_SYRINGE | INTRAVENOUS | Status: AC
  Filled 2013-05-08: qty 5

## 2013-05-08 MED ORDER — NALBUPHINE HCL 10 MG/ML IJ SOLN
5.0000 mg | INTRAMUSCULAR | Status: DC | PRN
  Administered 2013-05-08: 10 mg via SUBCUTANEOUS
  Filled 2013-05-08: qty 1

## 2013-05-08 MED ORDER — IBUPROFEN 600 MG PO TABS
600.0000 mg | ORAL_TABLET | Freq: Four times a day (QID) | ORAL | Status: DC
  Administered 2013-05-08 – 2013-05-11 (×11): 600 mg via ORAL
  Filled 2013-05-08 (×11): qty 1

## 2013-05-08 MED ORDER — MEPERIDINE HCL 25 MG/ML IJ SOLN: 6.2500 mg | INTRAMUSCULAR | Status: DC | PRN

## 2013-05-08 MED ORDER — OXYTOCIN 10 UNIT/ML IJ SOLN
INTRAMUSCULAR | Status: AC
  Filled 2013-05-08: qty 4

## 2013-05-08 MED ORDER — HYDROMORPHONE HCL PF 1 MG/ML IJ SOLN: 0.2500 mg | INTRAMUSCULAR | Status: DC | PRN

## 2013-05-08 MED ORDER — TETANUS-DIPHTH-ACELL PERTUSSIS 5-2.5-18.5 LF-MCG/0.5 IM SUSP: 0.5000 mL | Freq: Once | INTRAMUSCULAR | Status: DC

## 2013-05-08 MED ORDER — KETOROLAC TROMETHAMINE 30 MG/ML IJ SOLN: 15.0000 mg | Freq: Once | INTRAMUSCULAR | Status: DC | PRN

## 2013-05-08 MED ORDER — SODIUM CHLORIDE 0.9 % IJ SOLN: 3.0000 mL | INTRAMUSCULAR | Status: DC | PRN

## 2013-05-08 MED ORDER — MORPHINE SULFATE (PF) 0.5 MG/ML IJ SOLN
INTRAMUSCULAR | Status: DC | PRN
  Administered 2013-05-08: .15 mg via EPIDURAL

## 2013-05-08 MED ORDER — CEFAZOLIN SODIUM-DEXTROSE 2-3 GM-% IV SOLR
INTRAVENOUS | Status: AC
  Filled 2013-05-08: qty 50

## 2013-05-08 MED ORDER — NALBUPHINE SYRINGE 5 MG/0.5 ML
INJECTION | INTRAMUSCULAR | Status: AC
  Filled 2013-05-08: qty 1

## 2013-05-08 MED ORDER — ONDANSETRON HCL 4 MG/2ML IJ SOLN
INTRAMUSCULAR | Status: AC
  Filled 2013-05-08: qty 2

## 2013-05-08 MED ORDER — PHENYLEPHRINE HCL 10 MG/ML IJ SOLN
INTRAMUSCULAR | Status: DC | PRN
  Administered 2013-05-08: 80 ug via INTRAVENOUS
  Administered 2013-05-08 (×4): 40 ug via INTRAVENOUS
  Administered 2013-05-08: 80 ug via INTRAVENOUS
  Administered 2013-05-08: 40 ug via INTRAVENOUS
  Administered 2013-05-08 (×2): 80 ug via INTRAVENOUS
  Administered 2013-05-08: 40 ug via INTRAVENOUS

## 2013-05-08 MED ORDER — PROMETHAZINE HCL 25 MG/ML IJ SOLN: 6.2500 mg | INTRAMUSCULAR | Status: DC | PRN

## 2013-05-08 MED ORDER — WITCH HAZEL-GLYCERIN EX PADS: 1.0000 "application " | MEDICATED_PAD | CUTANEOUS | Status: DC | PRN

## 2013-05-08 MED ORDER — METOCLOPRAMIDE HCL 5 MG/ML IJ SOLN: 10.0000 mg | Freq: Three times a day (TID) | INTRAMUSCULAR | Status: DC | PRN

## 2013-05-08 MED ORDER — NALBUPHINE HCL 10 MG/ML IJ SOLN
5.0000 mg | INTRAMUSCULAR | Status: DC | PRN
  Filled 2013-05-08: qty 1

## 2013-05-08 MED ORDER — FENTANYL CITRATE 0.05 MG/ML IJ SOLN
INTRAMUSCULAR | Status: DC | PRN
  Administered 2013-05-08: 25 ug via INTRATHECAL

## 2013-05-08 MED ORDER — ZOLPIDEM TARTRATE 5 MG PO TABS: 5.0000 mg | ORAL_TABLET | Freq: Every evening | ORAL | Status: DC | PRN

## 2013-05-08 MED ORDER — DIPHENHYDRAMINE HCL 25 MG PO CAPS: 25.0000 mg | ORAL_CAPSULE | Freq: Four times a day (QID) | ORAL | Status: DC | PRN

## 2013-05-08 MED ORDER — ONDANSETRON HCL 4 MG/2ML IJ SOLN: 4.0000 mg | INTRAMUSCULAR | Status: DC | PRN

## 2013-05-08 MED ORDER — SIMETHICONE 80 MG PO CHEW: 80.0000 mg | CHEWABLE_TABLET | ORAL | Status: DC | PRN

## 2013-05-08 MED ORDER — LACTATED RINGERS IV SOLN
INTRAVENOUS | Status: DC
  Administered 2013-05-08: 1000 mL via INTRAVENOUS
  Administered 2013-05-08 (×2): via INTRAVENOUS

## 2013-05-08 MED ORDER — SCOPOLAMINE 1 MG/3DAYS TD PT72
1.0000 | MEDICATED_PATCH | Freq: Once | TRANSDERMAL | Status: DC
  Administered 2013-05-08: 1.5 mg via TRANSDERMAL

## 2013-05-08 MED ORDER — LACTATED RINGERS IV SOLN
INTRAVENOUS | Status: DC | PRN
  Administered 2013-05-08: 08:00:00 via INTRAVENOUS

## 2013-05-08 MED ORDER — DIPHENHYDRAMINE HCL 25 MG PO CAPS
25.0000 mg | ORAL_CAPSULE | ORAL | Status: DC | PRN
  Filled 2013-05-08: qty 1

## 2013-05-08 MED ORDER — OXYTOCIN 40 UNITS IN LACTATED RINGERS INFUSION - SIMPLE MED: 62.5000 mL/h | INTRAVENOUS | Status: AC

## 2013-05-08 MED ORDER — FENTANYL CITRATE 0.05 MG/ML IJ SOLN
INTRAMUSCULAR | Status: AC
  Filled 2013-05-08: qty 2

## 2013-05-08 MED ORDER — KETOROLAC TROMETHAMINE 60 MG/2ML IM SOLN
INTRAMUSCULAR | Status: AC
  Administered 2013-05-08: 60 mg via INTRAMUSCULAR
  Filled 2013-05-08: qty 2

## 2013-05-08 MED ORDER — DIPHENHYDRAMINE HCL 50 MG/ML IJ SOLN: 25.0000 mg | INTRAMUSCULAR | Status: DC | PRN

## 2013-05-08 MED ORDER — NALOXONE HCL 1 MG/ML IJ SOLN
1.0000 ug/kg/h | INTRAVENOUS | Status: DC | PRN
  Filled 2013-05-08: qty 2

## 2013-05-08 MED ORDER — OXYTOCIN 10 UNIT/ML IJ SOLN
INTRAMUSCULAR | Status: DC | PRN
  Administered 2013-05-08: 40 [IU] via INTRAMUSCULAR

## 2013-05-08 MED ORDER — MORPHINE SULFATE 0.5 MG/ML IJ SOLN
INTRAMUSCULAR | Status: AC
  Filled 2013-05-08: qty 10

## 2013-05-08 MED ORDER — SCOPOLAMINE 1 MG/3DAYS TD PT72
MEDICATED_PATCH | TRANSDERMAL | Status: AC
  Filled 2013-05-08: qty 1

## 2013-05-08 MED ORDER — MENTHOL 3 MG MT LOZG: 1.0000 | LOZENGE | OROMUCOSAL | Status: DC | PRN

## 2013-05-08 MED ORDER — LACTATED RINGERS IV SOLN: INTRAVENOUS | Status: DC

## 2013-05-08 MED ORDER — PRENATAL MULTIVITAMIN CH
1.0000 | ORAL_TABLET | Freq: Every day | ORAL | Status: DC
  Administered 2013-05-09 – 2013-05-11 (×3): 1 via ORAL
  Filled 2013-05-08 (×3): qty 1

## 2013-05-08 MED ORDER — CEFAZOLIN SODIUM-DEXTROSE 2-3 GM-% IV SOLR
2.0000 g | INTRAVENOUS | Status: AC
  Administered 2013-05-08: 2 g via INTRAVENOUS

## 2013-05-08 MED ORDER — DIPHENHYDRAMINE HCL 50 MG/ML IJ SOLN: 12.5000 mg | INTRAMUSCULAR | Status: DC | PRN

## 2013-05-08 MED ORDER — KETOROLAC TROMETHAMINE 60 MG/2ML IM SOLN: 60.0000 mg | Freq: Once | INTRAMUSCULAR | Status: AC | PRN

## 2013-05-08 MED ORDER — SIMETHICONE 80 MG PO CHEW
80.0000 mg | CHEWABLE_TABLET | Freq: Three times a day (TID) | ORAL | Status: DC
  Administered 2013-05-09 – 2013-05-11 (×7): 80 mg via ORAL

## 2013-05-08 SURGICAL SUPPLY — 27 items
CLAMP CORD UMBIL (MISCELLANEOUS) ×2 IMPLANT
CLIP FILSHIE TUBAL LIGA STRL (Clip) ×2 IMPLANT
CLOTH BEACON ORANGE TIMEOUT ST (SAFETY) ×2 IMPLANT
DRAPE LG THREE QUARTER DISP (DRAPES) ×2 IMPLANT
DRSG OPSITE POSTOP 4X10 (GAUZE/BANDAGES/DRESSINGS) ×2 IMPLANT
DURAPREP 26ML APPLICATOR (WOUND CARE) ×4 IMPLANT
ELECT REM PT RETURN 9FT ADLT (ELECTROSURGICAL) ×2
ELECTRODE REM PT RTRN 9FT ADLT (ELECTROSURGICAL) ×1 IMPLANT
GLOVE BIO SURGEON STRL SZ 6.5 (GLOVE) ×2 IMPLANT
GLOVE BIO SURGEON STRL SZ7.5 (GLOVE) ×2 IMPLANT
GLOVE BIOGEL PI IND STRL 6.5 (GLOVE) ×1 IMPLANT
GLOVE BIOGEL PI IND STRL 7.0 (GLOVE) ×2 IMPLANT
GLOVE BIOGEL PI INDICATOR 6.5 (GLOVE) ×1
GLOVE BIOGEL PI INDICATOR 7.0 (GLOVE) ×2
GOWN STRL REIN XL XLG (GOWN DISPOSABLE) ×8 IMPLANT
NS IRRIG 1000ML POUR BTL (IV SOLUTION) ×2 IMPLANT
PACK C SECTION WH (CUSTOM PROCEDURE TRAY) ×2 IMPLANT
PAD OB MATERNITY 4.3X12.25 (PERSONAL CARE ITEMS) ×2 IMPLANT
RTRCTR C-SECT PINK 25CM LRG (MISCELLANEOUS) ×2 IMPLANT
STAPLER VISISTAT 35W (STAPLE) ×2 IMPLANT
SUT MON AB 2-0 CT1 27 (SUTURE) ×2 IMPLANT
SUT PDS AB 0 CTX 60 (SUTURE) ×2 IMPLANT
SUT PLAIN 2 0 XLH (SUTURE) IMPLANT
SUT VIC AB 0 CTX 36 (SUTURE) ×4
SUT VIC AB 0 CTX36XBRD ANBCTRL (SUTURE) ×4 IMPLANT
TOWEL OR 17X24 6PK STRL BLUE (TOWEL DISPOSABLE) ×6 IMPLANT
TRAY FOLEY CATH 14FR (SET/KITS/TRAYS/PACK) ×2 IMPLANT

## 2013-05-08 NOTE — OR Nursing (Signed)
Care of patient taken over from stephanie vaughn rn - Buddy Loeffelholz rn

## 2013-05-08 NOTE — Consult Note (Signed)
Neonatology Note:   Attendance at C-section:    I was asked by Dr. Janee Morn to attend this repeat C/S at term. The mother is a G4P2A1 O pos, GBS neg with a history of HSV. ROM at delivery, fluid clear. Infant vigorous with good spontaneous cry and tone. Needed only minimal bulb suctioning. Ap 8/9. Lungs clear to ausc in DR. To CN to care of Pediatrician.   Doretha Sou, MD

## 2013-05-08 NOTE — Anesthesia Postprocedure Evaluation (Signed)
  Anesthesia Post-op Note  Patient: Claudia Fitzgerald  Procedure(s) Performed: Procedure(s): REPEAT CESAREAN SECTION WITH BILATERAL TUBAL LIGATION (N/A)  Patient is awake, responsive, moving her legs, and has signs of resolution of her numbness. Pain and nausea are reasonably well controlled. Vital signs are stable and clinically acceptable. Oxygen saturation is clinically acceptable. There are no apparent anesthetic complications at this time. Patient is ready for discharge.

## 2013-05-08 NOTE — Anesthesia Postprocedure Evaluation (Signed)
  Anesthesia Post-op Note  Patient: Claudia Fitzgerald  Procedure(s) Performed: Procedure(s): REPEAT CESAREAN SECTION WITH BILATERAL TUBAL LIGATION (N/A)  Patient Location: Mother/Baby  Anesthesia Type:Spinal  Level of Consciousness: awake  Airway and Oxygen Therapy: Patient Spontanous Breathing  Post-op Pain: mild  Post-op Assessment: Patient's Cardiovascular Status Stable and Respiratory Function Stable  Post-op Vital Signs: stable  Complications: No apparent anesthesia complications

## 2013-05-08 NOTE — Anesthesia Preprocedure Evaluation (Signed)
Anesthesia Evaluation  Patient identified by MRN, date of birth, ID band Patient awake    Reviewed: Allergy & Precautions, H&P , NPO status , Patient's Chart, lab work & pertinent test results  Airway Mallampati: II TM Distance: >3 FB Neck ROM: full    Dental no notable dental hx.    Pulmonary neg pulmonary ROS,    Pulmonary exam normal       Cardiovascular negative cardio ROS      Neuro/Psych negative neurological ROS  negative psych ROS   GI/Hepatic negative GI ROS, Neg liver ROS,   Endo/Other  negative endocrine ROS  Renal/GU negative Renal ROS  negative genitourinary   Musculoskeletal negative musculoskeletal ROS (+)   Abdominal Normal abdominal exam  (+)   Peds negative pediatric ROS (+)  Hematology negative hematology ROS (+)   Anesthesia Other Findings   Reproductive/Obstetrics (+) Pregnancy                           Anesthesia Physical Anesthesia Plan  ASA: II  Anesthesia Plan: Spinal   Post-op Pain Management:    Induction:   Airway Management Planned:   Additional Equipment:   Intra-op Plan:   Post-operative Plan:   Informed Consent: I have reviewed the patients History and Physical, chart, labs and discussed the procedure including the risks, benefits and alternatives for the proposed anesthesia with the patient or authorized representative who has indicated his/her understanding and acceptance.     Plan Discussed with: CRNA, Anesthesiologist and Surgeon  Anesthesia Plan Comments:         Anesthesia Quick Evaluation

## 2013-05-08 NOTE — Transfer of Care (Signed)
Immediate Anesthesia Transfer of Care Note  Patient: Claudia Fitzgerald  Procedure(s) Performed: Procedure(s): REPEAT CESAREAN SECTION WITH BILATERAL TUBAL LIGATION (N/A)  Patient Location: PACU  Anesthesia Type:Spinal  Level of Consciousness: awake  Airway & Oxygen Therapy: Patient Spontanous Breathing  Post-op Assessment: Report given to PACU RN  Post vital signs: Reviewed and stable  Complications: No apparent anesthesia complications

## 2013-05-08 NOTE — H&P (Signed)
27 y.o. [redacted]w[redacted]d  [id number] comes in for scheduled repeat c/s with BTL  Otherwise has good fetal movement and no bleeding.  Past Medical History  Diagnosis Date  . Herpes   . Termination of pregnancy     x 1  . Anxiety     no meds    Past Surgical History  Procedure Laterality Date  . Cesarean section  2008 and 2012    OB History   Grav Para Term Preterm Abortions TAB SAB Ect Mult Living   4 2 1 1 1 1  0 0 0 2     # Outc Date GA Lbr Len/2nd Wgt Sex Del Anes PTL Lv   1 TRM 10/08   4.540JW(1XB1YN) M LTCS EPI  Yes   2 PRE 5/12 [redacted]w[redacted]d  8.29FA(2ZH0QM) M LTCS Spinal Yes Yes   3 TAB            4 CUR               History   Social History  . Marital Status: Single    Spouse Name: N/A    Number of Children: N/A  . Years of Education: N/A   Occupational History  . Not on file.   Social History Main Topics  . Smoking status: Never Smoker   . Smokeless tobacco: Never Used  . Alcohol Use: Yes     Comment: socially but none with pregnancy  . Drug Use: No  . Sexually Active: Yes    Birth Control/ Protection: None     Comment: pregnant   Other Topics Concern  . Not on file   Social History Narrative  . No narrative on file   Review of patient's allergies indicates no known allergies.    Prenatal Transfer Tool  Maternal Diabetes: No Genetic Screening: not available Maternal Ultrasounds/Referrals: record not available Fetal Ultrasounds or other Referrals:  Record not available Maternal Substance Abuse:  No Significant Maternal Medications:  None Significant Maternal Lab Results: Lab values include: Group B Strep negative  Other PNC: herpes infection on valtrex    There were no vitals filed for this visit.   Lungs/Cor:  NAD Abdomen:  soft, gravid Ex:  no cords, erythema    A/P   Admit for schedule c/s and BTL 2g Ancef SCDs Other routine preop care  GBS Neg  Jalayna Josten

## 2013-05-08 NOTE — Brief Op Note (Signed)
05/08/2013  8:55 AM  PATIENT:  Claudia Fitzgerald  28 y.o. female  PRE-OPERATIVE DIAGNOSIS:  Repeat Cesarean Section and Desires Sterilization [phone]  POST-OPERATIVE DIAGNOSIS:  Repeat Cesarean Section and Desires Sterilization [phone]  PROCEDURE:  Procedure(s): REPEAT CESAREAN SECTION WITH BILATERAL TUBAL LIGATION (N/A)  SURGEON:  Surgeon(s) and Role:    * Philip Aspen, DO - Primary    * Miguel Aschoff, MD - Assisting    ANESTHESIA:   spinal  EBL:  Total I/O In: 3000 [I.V.:3000] Out: 1000 [Urine:500; Blood:500]   SPECIMEN:  Source of Specimen:  cord blood  DISPOSITION OF SPECIMEN:  N/A  COUNTS:  YES  TOURNIQUET:  * No tourniquets in log *  PATIENT DISPOSITION:  PACU - hemodynamically stable.

## 2013-05-09 ENCOUNTER — Encounter (HOSPITAL_COMMUNITY): Payer: Self-pay | Admitting: Obstetrics and Gynecology

## 2013-05-09 LAB — CBC
HCT: 30.9 % — ABNORMAL LOW (ref 36.0–46.0)
Hemoglobin: 9.9 g/dL — ABNORMAL LOW (ref 12.0–15.0)
MCHC: 32 g/dL (ref 30.0–36.0)
RBC: 3.95 MIL/uL (ref 3.87–5.11)

## 2013-05-09 NOTE — Progress Notes (Signed)
Subjective: Postpartum Day 1: Cesarean Delivery Patient reports incisional pain and + flatus.    Objective: Vital signs in last 24 hours: Temp:  [96.8 F (36 C)-98.9 F (37.2 C)] 98.2 F (36.8 C) (07/04 0815) Pulse Rate:  [63-82] 76 (07/04 0815) Resp:  [16-20] 18 (07/04 0815) BP: (81-137)/(42-74) 117/66 mmHg (07/04 0815) SpO2:  [95 %-100 %] 98 % (07/04 0815)  Physical Exam:  General: alert, cooperative and appears stated age Lochia: appropriate Uterine Fundus: firm Incision: healing well DVT Evaluation: No evidence of DVT seen on physical exam.   Recent Labs  05/06/13 1155 05/09/13 0540  HGB 10.6* 9.9*  HCT 32.5* 30.9*    Assessment/Plan: Status post Cesarean section. Doing well postoperatively.  Continue current care. Declines neonatal circ Dacotah Cabello H. 05/09/2013, 10:35 AM

## 2013-05-09 NOTE — Progress Notes (Signed)
Patient was referred for history of depression/anxiety. * Referral screened out by Clinical Social Worker because none of the following criteria appear to apply:  ~ History of anxiety/depression during this pregnancy, or of post-partum depression.  ~ Diagnosis of anxiety and/or depression within last 3 years, as per pt.  ~ History of depression due to pregnancy loss/loss of child  OR * Patient's symptoms currently being treated with medication and/or therapy.  Please contact the Clinical Social Worker if needs arise, or by the patient's request.

## 2013-05-09 NOTE — Op Note (Signed)
NAME:  Claudia Fitzgerald, Claudia Fitzgerald               ACCOUNT NO.:  [id number]  MEDICAL RECORD NO.:  [id number]  LOCATION:  9125                          FACILITY:  [facility]  PHYSICIAN:  Philip Aspen, DO    DATE OF BIRTH:  May 13, 1986  DATE OF PROCEDURE:  05/09/2013 DATE OF DISCHARGE:                              OPERATIVE REPORT   PREOPERATIVE DIAGNOSIS:  Desired repeat cesarean section with permanent sterilization.  POSTOPERATIVE DIAGNOSIS:  Desired repeat cesarean section with permanent sterilization.  PROCEDURE:  Repeat cesarean section with bilateral tubal ligation.  SURGEON:  Philip Aspen, DO.  ASSISTANT:  Miguel Aschoff, MD.  ANESTHESIA:  Spinal.  ESTIMATED BLOOD LOSS:  500 mL.  URINE OUTPUT:  500 mL.  FLUIDS:  3000 mL.  SPECIMENS:  Cord blood.  FINDINGS:  Female infant in cephalic presentation with Apgar 8 and 9, weight 7 pounds 8 ounces.  Normal tubes and ovaries noted bilaterally.  COMPLICATIONS:  None.  CONDITION:  Stable to PACU.  DESCRIPTION OF PROCEDURE:  Patient was taken to the operating room after consent was reaffirmed.  Spinal anesthesia was administered and found to be adequate.  She was prepped and draped in the normal sterile fashion in dorsal supine position.  A Pfannenstiel skin incision was made at the site of her prior incision carried down to the underlying layer of fascia with Bovie cautery.  The fascia was incised with scalpel and extended laterally with Mayo scissors.  Kocher clamps were placed at the superior aspect of the fascial incision, tented and the rectus muscles were dissected off bluntly and sharply.  Kocher clamps were then placed at the inferior aspect of the fascial incision and again the rectus muscles were dissected off bluntly and sharply.  The rectus muscles were separated at the midline with the help of hemostats.  The peritoneum was tented and entered sharply with good visualization of the bladder.  This incision was extended laterally by  manual traction and Alexis self- retractor was placed and with good visualization of the bladder, a low- transverse incision was made in the lower uterine segment.  The amniotic sac was entered with Allis clamps, and the incision was extended by a cephalic and caudal traction.  The infant's head was easily elevated and delivered through the incision and followed by the remainder of the body.  The cord was clamped doubly and the infant was handed off to awaiting neonatology.  The cord blood was collected.  Pitocin was started, and the placenta was delivered with external massage on the uterus.  The uterus was cleared of all clots and debris, and the uterine incision was closed with Vicryl in a running locked fashion.  The second layer placed onto horizontal Lembert imbrication was performed and excellent hemostasis was noted.  The left tube was identified and carried out to its fimbriated end.  A Filshie clip was applied with confirmation and encompassed the entire tube.  The same procedure was performed on the right side.  The [employer] was removed, additional debride was removed from the operative field.  The peritoneum was reapproximated and closed with Monocryl and this suture was extended to include the reapproximation of the rectus muscles, starting from  the inferior and going superiorly.  Excellent hemostasis was noted.  The fascia was reapproximated and closed with looped PDS in a running fashion.  Subcutaneous tissue was irrigated dried and minimal use of Bovie cautery was needed for hemostasis.  The skin was reapproximated and closed with staples.  Sponge, lap, and needle counts were correct x2.  The patient tolerated the procedure well and was taken to recovery in stable condition.          ______________________________ Philip Aspen, DO     [facility]/MEDQ  D:  05/09/2013  T:  05/09/2013  Job:  [id number]

## 2013-05-10 NOTE — Progress Notes (Signed)
Subjective: Postpartum Day 2: Cesarean Delivery Patient reports incisional pain, tolerating PO and + flatus.  Feeling a lot of gas pain  Objective: Vital signs in last 24 hours: Temp:  [97.8 F (36.6 C)-98 F (36.7 C)] 97.8 F (36.6 C) (07/05 0710) Pulse Rate:  [70-80] 70 (07/05 0710) Resp:  [18-20] 18 (07/05 0710) BP: (94-95)/(60-65) 94/60 mmHg (07/05 0710)  Physical Exam:  General: alert, cooperative and appears stated age Lochia: appropriate Uterine Fundus: firm Incision: healing well DVT Evaluation: No evidence of DVT seen on physical exam.   Recent Labs  05/09/13 0540  HGB 9.9*  HCT 30.9*    Assessment/Plan: Status post Cesarean section. Doing well postoperatively.  Continue current care. Pt desires d/c tomorrow Claudia Matura H. 05/10/2013, 11:13 AM

## 2013-05-11 MED ORDER — OXYCODONE-ACETAMINOPHEN 5-325 MG PO TABS: 2.0000 | ORAL_TABLET | ORAL | Status: DC | PRN

## 2013-05-11 MED ORDER — IBUPROFEN 600 MG PO TABS
600.0000 mg | ORAL_TABLET | Freq: Four times a day (QID) | ORAL | Status: DC | PRN
Start: 2013-05-11 — End: 2015-09-27

## 2013-05-11 MED ORDER — DOCUSATE SODIUM 100 MG PO CAPS: 100.0000 mg | ORAL_CAPSULE | Freq: Two times a day (BID) | ORAL | Status: DC

## 2013-05-11 NOTE — Discharge Summary (Signed)
Obstetric Discharge Summary Reason for Admission: cesarean section Prenatal Procedures: ultrasound Intrapartum Procedures: cesarean: low cervical, transverse and tubal ligation Postpartum Procedures: none Complications-Operative and Postpartum: none Hemoglobin  Date Value Range Status  05/09/2013 9.9* 12.0 - 15.0 g/dL Final     HCT  Date Value Range Status  05/09/2013 30.9* 36.0 - 46.0 % Final    Physical Exam:  General: alert, cooperative and appears stated age 8: appropriate Uterine Fundus: firm Incision: healing well DVT Evaluation: No evidence of DVT seen on physical exam.  Discharge Diagnoses: Term Pregnancy-delivered  Discharge Information: Date: 05/11/2013 Activity: pelvic rest Diet: routine Medications: Ibuprofen, Colace and Percocet Condition: improved Instructions: refer to practice specific booklet Discharge to: home Follow-up Information   Follow up with CALLAHAN, SIDNEY, DO In 4 weeks. (For a postpartum evaluation)    Contact information:   [address] [place] [address] [phone]       Newborn Data: Live born female  Birth Weight: 7 lb 8.5 oz (3415 g) APGAR: 8, 9  Home with mother.  Armanii Pressnell H. 05/11/2013, 10:49 AM

## 2013-05-11 NOTE — Lactation Note (Signed)
This note was copied from the chart of Claudia Fitzgerald. Lactation Consultation Note  Patient Name: Claudia Rudie Fitzgerald Today's Date: 05/11/2013 Reason for consult: Follow-up assessment   Maternal Data    Feeding   LATCH Score/Interventions                      Lactation Tools Discussed/Used     Consult Status Consult Status: Complete  Mom reports that baby has been nursing well and she can get him to latch without the nipple shield. Reports that breasts are feeling fuller this morning. Reports that baby just fed 45 minutes ago. No questions at present. To call prn  Pamelia Hoit 05/11/2013, 8:49 AM

## 2013-07-29 ENCOUNTER — Encounter (HOSPITAL_COMMUNITY): Payer: Self-pay | Admitting: *Deleted

## 2013-07-29 ENCOUNTER — Emergency Department (INDEPENDENT_AMBULATORY_CARE_PROVIDER_SITE_OTHER)
Admission: EM | Admit: 2013-07-29 | Discharge: 2013-07-29 | Disposition: A | Discharge status: Home / Self Care | Payer: Federal, State, Local not specified - PPO | Source: Home / Self Care | Attending: Emergency Medicine | Admitting: Emergency Medicine

## 2013-07-29 DIAGNOSIS — J40 Bronchitis, not specified as acute or chronic: Secondary | ICD-10-CM

## 2013-07-29 MED ORDER — AZITHROMYCIN 250 MG PO TABS: 250.0000 mg | ORAL_TABLET | Freq: Every day | ORAL | Status: DC

## 2013-07-29 NOTE — ED Provider Notes (Signed)
Medical screening examination/treatment/procedure(s) were performed by non-physician practitioner and as supervising physician I was immediately available for consultation/collaboration.  Leslee Home, M.D.  Reuben Likes, MD 07/29/13 [phone]

## 2013-07-29 NOTE — ED Provider Notes (Signed)
CSN: [id number]     Arrival date & time 07/29/13  1624 History   First MD Initiated Contact with Patient 07/29/13 1705     Chief Complaint  Patient presents with  . Cough   (Consider location/radiation/quality/duration/timing/severity/associated sxs/prior Treatment) Patient is a 27 y.o. female presenting with cough. The history is provided by the patient. No language interpreter was used.  Cough Cough characteristics:  Productive Sputum characteristics:  Clear Severity:  Moderate Duration:  2 weeks Timing:  Constant Progression:  Worsening Chronicity:  New Smoker: no   Relieved by:  Nothing Worsened by:  Nothing tried Ineffective treatments:  None tried Associated symptoms: sore throat   Associated symptoms: no chest pain and no fever     Past Medical History  Diagnosis Date  . Herpes   . Termination of pregnancy     x 1  . Anxiety     no meds   Past Surgical History  Procedure Laterality Date  . Cesarean section  2008 and 2012  . Cesarean section with bilateral tubal ligation N/A 05/08/2013    Procedure: REPEAT CESAREAN SECTION WITH BILATERAL TUBAL LIGATION;  Surgeon: Philip Aspen, DO;  Location: [facility];  Service: Obstetrics;  Laterality: N/A;   History reviewed. No pertinent family history. History  Substance Use Topics  . Smoking status: Never Smoker   . Smokeless tobacco: Never Used  . Alcohol Use: Yes     Comment: socially but none with pregnancy   OB History   Grav Para Term Preterm Abortions TAB SAB Ect Mult Living   4 3 2 1 1 1  0 0 0 3     Review of Systems  Constitutional: Negative for fever.  HENT: Positive for sore throat.   Respiratory: Positive for cough.   Cardiovascular: Negative for chest pain.  All other systems reviewed and are negative.    Allergies  Review of patient's allergies indicates no known allergies.  Home Medications   Current Outpatient Rx  Name  Route  Sig  Dispense  Refill  . docusate sodium (COLACE) 100 MG capsule   Oral   Take 1 capsule (100 mg total) by mouth 2 (two) times daily.   60 capsule   0   . ibuprofen (ADVIL,MOTRIN) 600 MG tablet   Oral   Take 1 tablet (600 mg total) by mouth every 6 (six) hours as needed for pain.   90 tablet   0   . oxyCODONE-acetaminophen (ROXICET) 5-325 MG per tablet   Oral   Take 2 tablets by mouth every 4 (four) hours as needed for pain. May take 1-2 tablets every 4-6 hours as needed for pain   30 tablet   0   . Prenatal Vit-Fe Fumarate-FA (PRENATAL MULTIVITAMIN) TABS   Oral   Take 1 tablet by mouth daily at 12 noon.          BP 120/74  Pulse 72  Temp(Src) 98.6 F (37 C) (Oral)  Resp 16  SpO2 100%  LMP 07/07/2013  Breastfeeding? No Physical Exam  Nursing note and vitals reviewed. Constitutional: She is oriented to person, place, and time. She appears well-developed and well-nourished.  HENT:  Head: Normocephalic.  Right Ear: External ear normal.  Left Ear: External ear normal.  Nose: Nose normal.  erythematous  Eyes: Conjunctivae and EOM are normal. Pupils are equal, round, and reactive to light.  Neck: Normal range of motion. Thyromegaly present.  Cardiovascular: Normal rate.   Pulmonary/Chest: Effort normal.  Abdominal: Soft. She exhibits no  distension.  Musculoskeletal: Normal range of motion.  Neurological: She is alert and oriented to person, place, and time.  Psychiatric: She has a normal mood and affect.    ED Course  Procedures (including critical care time) Labs Review Labs Reviewed - No data to display Imaging Review No results found.  MDM   1. Bronchitis    zithromax   Elson Areas, [place] 07/29/13 1724

## 2013-07-29 NOTE — ED Notes (Signed)
Pt  Reports  Symptoms   Of  Cough  /  Congested    With  Runny  Nose     stuffyness  With  Sinus  Congestion                Symptoms   Are   Persisting   For  sev  Weeks

## 2013-08-04 NOTE — ED Notes (Signed)
TSH: 1.232 WNL.  No further action needed. Vassie Moselle 08/04/2013

## 2013-08-12 ENCOUNTER — Telehealth (HOSPITAL_COMMUNITY): Payer: Self-pay | Admitting: *Deleted

## 2013-08-12 NOTE — ED Notes (Signed)
TSH 1.232 WNL.  Message sent to [employer] PA.  She wrote to notify pt. of results.  I called pt. And left a message to call. Vassie Moselle 08/12/2013

## 2013-08-13 ENCOUNTER — Telehealth (HOSPITAL_COMMUNITY): Payer: Self-pay | Admitting: *Deleted

## 2013-08-14 ENCOUNTER — Telehealth (HOSPITAL_COMMUNITY): Payer: Self-pay | Admitting: *Deleted

## 2014-09-07 ENCOUNTER — Encounter (HOSPITAL_COMMUNITY): Payer: Self-pay | Admitting: *Deleted

## 2015-09-26 ENCOUNTER — Encounter (HOSPITAL_COMMUNITY): Payer: Self-pay | Admitting: Emergency Medicine

## 2015-09-26 ENCOUNTER — Emergency Department (HOSPITAL_COMMUNITY)
Admission: EM | Admit: 2015-09-26 | Discharge: 2015-09-27 | Disposition: A | Discharge status: Home / Self Care | Payer: BC Managed Care – PPO | Attending: Emergency Medicine | Admitting: Emergency Medicine

## 2015-09-26 DIAGNOSIS — Z3202 Encounter for pregnancy test, result negative: Secondary | ICD-10-CM | POA: Diagnosis not present

## 2015-09-26 DIAGNOSIS — R102 Pelvic and perineal pain unspecified side: Secondary | ICD-10-CM

## 2015-09-26 DIAGNOSIS — Z8659 Personal history of other mental and behavioral disorders: Secondary | ICD-10-CM | POA: Diagnosis not present

## 2015-09-26 DIAGNOSIS — N730 Acute parametritis and pelvic cellulitis: Secondary | ICD-10-CM | POA: Diagnosis not present

## 2015-09-26 DIAGNOSIS — Z8619 Personal history of other infectious and parasitic diseases: Secondary | ICD-10-CM | POA: Insufficient documentation

## 2015-09-26 DIAGNOSIS — R1032 Left lower quadrant pain: Secondary | ICD-10-CM

## 2015-09-26 DIAGNOSIS — Z87442 Personal history of urinary calculi: Secondary | ICD-10-CM | POA: Insufficient documentation

## 2015-09-26 HISTORY — DX: Calculus of kidney: N20.0

## 2015-09-26 LAB — URINALYSIS, ROUTINE W REFLEX MICROSCOPIC
Bilirubin Urine: NEGATIVE
GLUCOSE, UA: NEGATIVE mg/dL
HGB URINE DIPSTICK: NEGATIVE
Ketones, ur: NEGATIVE mg/dL
Leukocytes, UA: NEGATIVE
Nitrite: NEGATIVE
PH: 6 (ref 5.0–8.0)
Protein, ur: NEGATIVE mg/dL
SPECIFIC GRAVITY, URINE: 1.027 (ref 1.005–1.030)

## 2015-09-26 LAB — CBC
HEMATOCRIT: 37 % (ref 36.0–46.0)
HEMOGLOBIN: 12.2 g/dL (ref 12.0–15.0)
MCH: 26.9 pg (ref 26.0–34.0)
MCHC: 33 g/dL (ref 30.0–36.0)
MCV: 81.7 fL (ref 78.0–100.0)
Platelets: 249 10*3/uL (ref 150–400)
RBC: 4.53 MIL/uL (ref 3.87–5.11)
RDW: 13.4 % (ref 11.5–15.5)
WBC: 8.9 10*3/uL (ref 4.0–10.5)

## 2015-09-26 LAB — COMPREHENSIVE METABOLIC PANEL
ALT: 19 U/L (ref 14–54)
ANION GAP: 8 (ref 5–15)
AST: 16 U/L (ref 15–41)
Albumin: 3.4 g/dL — ABNORMAL LOW (ref 3.5–5.0)
Alkaline Phosphatase: 64 U/L (ref 38–126)
BUN: 11 mg/dL (ref 6–20)
CHLORIDE: 103 mmol/L (ref 101–111)
CO2: 27 mmol/L (ref 22–32)
Calcium: 9.1 mg/dL (ref 8.9–10.3)
Creatinine, Ser: 0.69 mg/dL (ref 0.44–1.00)
GFR calc non Af Amer: >60 mL/min (ref >60)
Glucose, Bld: 118 mg/dL — ABNORMAL HIGH (ref 65–99)
Potassium: 3.7 mmol/L (ref 3.5–5.1)
SODIUM: 138 mmol/L (ref 135–145)
Total Bilirubin: 0.3 mg/dL (ref 0.3–1.2)
Total Protein: 6.7 g/dL (ref 6.5–8.1)

## 2015-09-26 LAB — WET PREP, GENITAL
Sperm: NONE SEEN
Trich, Wet Prep: NONE SEEN
Yeast Wet Prep HPF POC: NONE SEEN

## 2015-09-26 LAB — LIPASE, BLOOD: LIPASE: 25 U/L (ref 11–51)

## 2015-09-26 LAB — POC URINE PREG, ED: PREG TEST UR: NEGATIVE

## 2015-09-26 NOTE — ED Notes (Signed)
EDP at bedside  

## 2015-09-26 NOTE — ED Provider Notes (Signed)
CSN: [id number]     Arrival date & time 09/26/15  2150 History   First MD Initiated Contact with Patient 09/26/15 2214     Chief Complaint  Patient presents with  . Pelvic Pain  . Abdominal Pain     (Consider location/radiation/quality/duration/timing/severity/associated sxs/prior Treatment) Patient is a 29 y.o. female presenting with pelvic pain and abdominal pain. The history is provided by the patient and medical records. No language interpreter was used.  Pelvic Pain Associated symptoms include abdominal pain. Pertinent negatives include no chest pain, coughing, diaphoresis, fatigue, fever, headaches, nausea, rash or vomiting.  Abdominal Pain Associated symptoms: no chest pain, no constipation, no cough, no diarrhea, no dysuria, no fatigue, no fever, no hematuria, no nausea, no shortness of breath and no vomiting      Claudia Fitzgerald is a 29 y.o. female [phone] with a hx of kidney stone, c-section presents to the Emergency Department complaining of gradual, persistent, progressively worsening sharp LLQ abd pain onset 7:30pm.  Pt reports pain is the same as her previous kidney stone pain.  Pt reports her pain has improved and now rates her pain at a 4/10.  Denies fever, chills, back pain, vaginal discharge, dysuria, hematuria.   Sitting back makes it better and walking makes it worse.  LMP: 09/13/15  Pt reports she is sexually active with 1 female partner.  Pt with remote hx of STD.     Past Medical History  Diagnosis Date  . Herpes   . Termination of pregnancy     x 1  . Anxiety     no meds  . Kidney stones    Past Surgical History  Procedure Laterality Date  . Cesarean section  2008 and 2012  . Cesarean section with bilateral tubal ligation N/A 05/08/2013    Procedure: REPEAT CESAREAN SECTION WITH BILATERAL TUBAL LIGATION;  Surgeon: Philip Aspen, DO;  Location: [facility];  Service: Obstetrics;  Laterality: N/A;   No family history on file. Social History  Substance Use Topics   . Smoking status: Never Smoker   . Smokeless tobacco: Never Used  . Alcohol Use: Yes     Comment: socially but none with pregnancy   OB History    Gravida Para Term Preterm AB TAB SAB Ectopic Multiple Living   0 0 0 3     Review of Systems  Constitutional: Negative for fever, diaphoresis, appetite change, fatigue and unexpected weight change.  HENT: Negative for mouth sores.   Eyes: Negative for visual disturbance.  Respiratory: Negative for cough, chest tightness, shortness of breath and wheezing.   Cardiovascular: Negative for chest pain.  Gastrointestinal: Positive for abdominal pain. Negative for nausea, vomiting, diarrhea and constipation.  Endocrine: Negative for polydipsia, polyphagia and polyuria.  Genitourinary: Positive for pelvic pain. Negative for dysuria, urgency, frequency and hematuria.  Musculoskeletal: Negative for back pain and neck stiffness.  Skin: Negative for rash.  Allergic/Immunologic: Negative for immunocompromised state.  Neurological: Negative for syncope, light-headedness and headaches.  Hematological: Does not bruise/bleed easily.  Psychiatric/Behavioral: Negative for sleep disturbance. The patient is not nervous/anxious.       Allergies  Review of patient's allergies indicates no known allergies.  Home Medications   Prior to Admission medications   Medication Sig Start Date End Date Taking? Authorizing Provider  doxycycline (VIBRAMYCIN) 100 MG capsule Take 1 capsule (100 mg total) by mouth 2 (two) times daily. 09/27/15   Deondrae Mcgrail, PA-C  HYDROcodone-acetaminophen (NORCO/VICODIN) 5-325 MG  tablet Take 1 tablet by mouth every 8 (eight) hours as needed for moderate pain or severe pain. 09/27/15   Calandria Mullings, PA-C   BP 101/63 mmHg  Pulse 65  Temp(Src) 98.1 F (36.7 C) (Oral)  Resp 15  Ht 4\' 9"  (1.448 m)  Wt 168 lb (76.204 kg)  BMI 36.34 kg/m2  SpO2 100%  LMP 09/11/2015 Physical Exam  Constitutional: She  appears well-developed and well-nourished. No distress.  Awake, alert, nontoxic appearance  HENT:  Head: Normocephalic and atraumatic.  Mouth/Throat: Oropharynx is clear and moist. No oropharyngeal exudate.  Eyes: Conjunctivae are normal. No scleral icterus.  Neck: Normal range of motion. Neck supple.  Cardiovascular: Normal rate, regular rhythm, normal heart sounds and intact distal pulses.   No murmur heard. Pulmonary/Chest: Effort normal and breath sounds normal. No respiratory distress. She has no wheezes.  Equal chest expansion  Abdominal: Soft. Bowel sounds are normal. She exhibits no mass. There is tenderness in the left lower quadrant. There is no rebound, no guarding and no CVA tenderness. Hernia confirmed negative in the right inguinal area and confirmed negative in the left inguinal area.  Left lower quadrant/pelvic tenderness without tenderness anywhere else in the abdomen No guarding, rebound or CVA tenderness  Genitourinary: Uterus normal. No labial fusion. There is no rash, tenderness or lesion on the right labia. There is no rash, tenderness or lesion on the left labia. Uterus is not deviated, not enlarged, not fixed and not tender. Cervix exhibits motion tenderness. Cervix exhibits no discharge and no friability. Right adnexum displays tenderness. Right adnexum displays no mass and no fullness. Left adnexum displays tenderness. Left adnexum displays no mass and no fullness. No erythema, tenderness or bleeding in the vagina. No foreign body around the vagina. No signs of injury around the vagina. Vaginal discharge (White, thin, small amount) found.  Cervical motion tenderness and bilateral adnexal tenderness right greater than left  Musculoskeletal: Normal range of motion. She exhibits no edema.  Lymphadenopathy:       Right: No inguinal adenopathy present.       Left: No inguinal adenopathy present.  Neurological: She is alert.  Speech is clear and goal oriented Moves  extremities without ataxia  Skin: Skin is warm and dry. She is not diaphoretic. No erythema.  Psychiatric: She has a normal mood and affect.  Nursing note and vitals reviewed.   ED Course  Procedures (including critical care time) Labs Review Labs Reviewed  WET PREP, GENITAL - Abnormal; Notable for the following:    Clue Cells Wet Prep HPF POC PRESENT (*)    WBC, Wet Prep HPF POC FEW (*)    All other components within normal limits  COMPREHENSIVE METABOLIC PANEL - Abnormal; Notable for the following:    Glucose, Bld 118 (*)    Albumin 3.4 (*)    All other components within normal limits  LIPASE, BLOOD  CBC  URINALYSIS, ROUTINE W REFLEX MICROSCOPIC (NOT AT [facility])  POC URINE PREG, ED  GC/CHLAMYDIA PROBE AMP ([place]) NOT AT [facility]    Imaging Review US Transvaginal Non-ob  09/27/2015  CLINICAL DATA:  Bi acute onset of left lower quadrant abdominal pain. Initial encounter. EXAM: TRANSABDOMINAL AND TRANSVAGINAL ULTRASOUND OF PELVIS DOPPLER ULTRASOUND OF OVARIES TECHNIQUE: Both transabdominal and transvaginal ultrasound examinations of the pelvis were performed. Transabdominal technique was performed for global imaging of the pelvis including uterus, ovaries, adnexal regions, and pelvic cul-de-sac. It was necessary to proceed with endovaginal exam following the transabdominal exam to  visualize the uterus and ovaries in greater detail. Color and duplex Doppler ultrasound was utilized to evaluate blood flow to the ovaries. COMPARISON:  Pelvic ultrasound performed 04/06/2013, and CT of the abdomen and pelvis from 02/19/2015 FINDINGS: Uterus Measurements: 9.3 x 5.2 x 6.2 cm. No fibroids or other mass visualized. Anteverted in appearance. Endometrium Thickness: 1.2 cm.  No focal abnormality visualized. Right ovary Measurements: 3.7 x 2.4 x 2.7 cm. Normal appearance/no adnexal mass. Left ovary Measurements: 4.4 x 2.5 x 2.4 cm. Normal appearance/no adnexal mass. Pulsed Doppler evaluation of both  ovaries demonstrates normal low-resistance arterial and venous waveforms. Other findings No free fluid is seen within the pelvic cul-de-sac. IMPRESSION: Unremarkable pelvic ultrasound.  No evidence for ovarian torsion. Electronically Signed   By: Roanna RaiderJeffery  Chang M.D.   On: 09/27/2015 01:57   [place] Pelvis Complete  09/27/2015  CLINICAL DATA:  Bi acute onset of left lower quadrant abdominal pain. Initial encounter. EXAM: TRANSABDOMINAL AND TRANSVAGINAL ULTRASOUND OF PELVIS DOPPLER ULTRASOUND OF OVARIES TECHNIQUE: Both transabdominal and transvaginal ultrasound examinations of the pelvis were performed. Transabdominal technique was performed for global imaging of the pelvis including uterus, ovaries, adnexal regions, and pelvic cul-de-sac. It was necessary to proceed with endovaginal exam following the transabdominal exam to visualize the uterus and ovaries in greater detail. Color and duplex Doppler ultrasound was utilized to evaluate blood flow to the ovaries. COMPARISON:  Pelvic ultrasound performed 04/06/2013, and CT of the abdomen and pelvis from 02/19/2015 FINDINGS: Uterus Measurements: 9.3 x 5.2 x 6.2 cm. No fibroids or other mass visualized. Anteverted in appearance. Endometrium Thickness: 1.2 cm.  No focal abnormality visualized. Right ovary Measurements: 3.7 x 2.4 x 2.7 cm. Normal appearance/no adnexal mass. Left ovary Measurements: 4.4 x 2.5 x 2.4 cm. Normal appearance/no adnexal mass. Pulsed Doppler evaluation of both ovaries demonstrates normal low-resistance arterial and venous waveforms. Other findings No free fluid is seen within the pelvic cul-de-sac. IMPRESSION: Unremarkable pelvic ultrasound.  No evidence for ovarian torsion. Electronically Signed   By: Roanna RaiderJeffery  Chang M.D.   On: 09/27/2015 01:57   [place] Art/ven Flow Abd Pelv Doppler  09/27/2015  CLINICAL DATA:  Bi acute onset of left lower quadrant abdominal pain. Initial encounter. EXAM: TRANSABDOMINAL AND TRANSVAGINAL ULTRASOUND OF PELVIS DOPPLER  ULTRASOUND OF OVARIES TECHNIQUE: Both transabdominal and transvaginal ultrasound examinations of the pelvis were performed. Transabdominal technique was performed for global imaging of the pelvis including uterus, ovaries, adnexal regions, and pelvic cul-de-sac. It was necessary to proceed with endovaginal exam following the transabdominal exam to visualize the uterus and ovaries in greater detail. Color and duplex Doppler ultrasound was utilized to evaluate blood flow to the ovaries. COMPARISON:  Pelvic ultrasound performed 04/06/2013, and CT of the abdomen and pelvis from 02/19/2015 FINDINGS: Uterus Measurements: 9.3 x 5.2 x 6.2 cm. No fibroids or other mass visualized. Anteverted in appearance. Endometrium Thickness: 1.2 cm.  No focal abnormality visualized. Right ovary Measurements: 3.7 x 2.4 x 2.7 cm. Normal appearance/no adnexal mass. Left ovary Measurements: 4.4 x 2.5 x 2.4 cm. Normal appearance/no adnexal mass. Pulsed Doppler evaluation of both ovaries demonstrates normal low-resistance arterial and venous waveforms. Other findings No free fluid is seen within the pelvic cul-de-sac. IMPRESSION: Unremarkable pelvic ultrasound.  No evidence for ovarian torsion. Electronically Signed   By: Roanna RaiderJeffery  Chang M.D.   On: 09/27/2015 01:57   I have personally reviewed and evaluated these images and lab results as part of my medical decision-making.   EKG Interpretation None  MDM   Final diagnoses:  LLQ pain  Pelvic pain in female  PID (acute pelvic inflammatory disease)   Claudia Fitzgerald presents with left lower quadrant abdominal pain. Patient without CVA tenderness, dysuria, hematuria, urinary urgency or urinary frequency. Her UA is without evidence of urinary tract infection or hematuria.  On pelvic exam small amount of vaginal discharge with mild cervical motion tenderness and bilateral adnexal tenderness, right greater than left.  Concern for cervicitis versus PID.  Labs are reassuring.   Pregnancy test is negative. No leukocytosis. Patient is afebrile without tachycardia or hypotension. She's had no nausea or vomiting with these symptoms. Patient denies diarrhea or sick contacts.  Pelvic ultrasound with no evidence of tubo-ovarian abscess, ovarian cyst, ovarian torsion or abnormal pelvic free fluid. Patient's pain has been controlled here in the emergency department. She remained stable with stable vital signs. No development of other symptoms.  Discussed with patient concern for possible pelvic infection though wet prep only has few white blood cells. We will treat prophylactically. Patient is to follow with OB/GYN within one week. She is to return to the emergency department for fevers, persistent vomiting or other concerns. On repeat exam patient's abdomen is soft and nontender without guarding or rebound. Doubt appendicitis, cholecystitis, colitis, diverticulitis.  Discussed continued potential for renal colic though I think this is less likely due to her physical exam.  Conservative therapies discussed.  BP 101/63 mmHg  Pulse 65  Temp(Src) 98.1 F (36.7 C) (Oral)  Resp 15  Ht  (1.448 m)  Wt 168 lb (76.204 kg)  BMI 36.34 kg/m2  SpO2 100%  LMP 09/11/2015   Dahlia Client Jojo Pehl, PA-C 09/27/15 0300  Doug Sou, MD 09/27/15 1455

## 2015-09-26 NOTE — ED Notes (Addendum)
C/o LLQ/pelvic pain x 1 hour.  Denies nausea, vomiting, and urinary complaints.  History of kidney stone.

## 2015-09-27 ENCOUNTER — Emergency Department (HOSPITAL_COMMUNITY): Payer: BC Managed Care – PPO

## 2015-09-27 DIAGNOSIS — N730 Acute parametritis and pelvic cellulitis: Secondary | ICD-10-CM | POA: Diagnosis not present

## 2015-09-27 LAB — GC/CHLAMYDIA PROBE AMP (~~LOC~~) NOT AT ARMC
CHLAMYDIA, DNA PROBE: NEGATIVE
Neisseria Gonorrhea: NEGATIVE

## 2015-09-27 MED ORDER — DOXYCYCLINE HYCLATE 100 MG PO CAPS: 100.0000 mg | ORAL_CAPSULE | Freq: Two times a day (BID) | ORAL | Status: AC

## 2015-09-27 MED ORDER — METRONIDAZOLE 500 MG PO TABS
2000.0000 mg | ORAL_TABLET | Freq: Once | ORAL | Status: AC
  Administered 2015-09-27: 2000 mg via ORAL
  Filled 2015-09-27: qty 4

## 2015-09-27 MED ORDER — CEFTRIAXONE SODIUM 250 MG IJ SOLR
250.0000 mg | Freq: Once | INTRAMUSCULAR | Status: AC
  Administered 2015-09-27: 250 mg via INTRAMUSCULAR
  Filled 2015-09-27: qty 250

## 2015-09-27 MED ORDER — AZITHROMYCIN 250 MG PO TABS
1000.0000 mg | ORAL_TABLET | Freq: Once | ORAL | Status: AC
  Administered 2015-09-27: 1000 mg via ORAL
  Filled 2015-09-27: qty 4

## 2015-09-27 MED ORDER — HYDROCODONE-ACETAMINOPHEN 5-325 MG PO TABS: 1.0000 | ORAL_TABLET | Freq: Three times a day (TID) | ORAL | Status: DC | PRN

## 2015-09-27 MED ORDER — LIDOCAINE HCL (PF) 1 % IJ SOLN
5.0000 mL | Freq: Once | INTRAMUSCULAR | Status: AC
  Administered 2015-09-27: 5 mL via INTRADERMAL
  Filled 2015-09-27: qty 5

## 2015-09-27 MED ORDER — HYDROCODONE-ACETAMINOPHEN 5-325 MG PO TABS
2.0000 | ORAL_TABLET | Freq: Once | ORAL | Status: AC
  Administered 2015-09-27: 2 via ORAL
  Filled 2015-09-27: qty 2

## 2015-09-27 NOTE — ED Notes (Signed)
Called US to get update, stated that this pt would be next to come over

## 2015-09-27 NOTE — ED Notes (Signed)
Pt back from US

## 2015-09-27 NOTE — Discharge Instructions (Signed)
1. Medications: usual home medications, doxycycline 2. Treatment: rest, drink plenty of fluids, use a condom with every sexual encounter 3. Follow Up: Please followup with your primary doctor in 3 days for discussion of your diagnoses and further evaluation after today's visit; if you do not have a primary care doctor use the resource guide provided to find one; Please return to the ER for worsening symptoms, high fevers or persistent vomiting.  You have been tested for HIV, syphilis, chlamydia and gonorrhea.  These results will be available in approximately 3 days.  Please inform all sexual partners if you test positive for any of these diseases.

## 2015-09-27 NOTE — ED Notes (Signed)
Pt to US.

## 2016-01-31 ENCOUNTER — Encounter (HOSPITAL_COMMUNITY): Payer: Self-pay | Admitting: Emergency Medicine

## 2016-01-31 ENCOUNTER — Emergency Department (HOSPITAL_COMMUNITY): Payer: BC Managed Care – PPO

## 2016-01-31 ENCOUNTER — Emergency Department (HOSPITAL_COMMUNITY)
Admission: EM | Admit: 2016-01-31 | Discharge: 2016-01-31 | Disposition: A | Discharge status: Home / Self Care | Payer: BC Managed Care – PPO | Attending: Emergency Medicine | Admitting: Emergency Medicine

## 2016-01-31 DIAGNOSIS — R079 Chest pain, unspecified: Secondary | ICD-10-CM | POA: Diagnosis present

## 2016-01-31 DIAGNOSIS — Z792 Long term (current) use of antibiotics: Secondary | ICD-10-CM | POA: Diagnosis not present

## 2016-01-31 DIAGNOSIS — Z8619 Personal history of other infectious and parasitic diseases: Secondary | ICD-10-CM | POA: Diagnosis not present

## 2016-01-31 DIAGNOSIS — R0602 Shortness of breath: Secondary | ICD-10-CM | POA: Diagnosis not present

## 2016-01-31 DIAGNOSIS — R0789 Other chest pain: Secondary | ICD-10-CM | POA: Insufficient documentation

## 2016-01-31 DIAGNOSIS — E669 Obesity, unspecified: Secondary | ICD-10-CM | POA: Diagnosis not present

## 2016-01-31 DIAGNOSIS — Z3202 Encounter for pregnancy test, result negative: Secondary | ICD-10-CM | POA: Insufficient documentation

## 2016-01-31 DIAGNOSIS — R200 Anesthesia of skin: Secondary | ICD-10-CM | POA: Insufficient documentation

## 2016-01-31 DIAGNOSIS — Z87442 Personal history of urinary calculi: Secondary | ICD-10-CM | POA: Diagnosis not present

## 2016-01-31 LAB — BASIC METABOLIC PANEL
Anion gap: 8 (ref 5–15)
BUN: 9 mg/dL (ref 6–20)
CHLORIDE: 107 mmol/L (ref 101–111)
CO2: 23 mmol/L (ref 22–32)
Calcium: 9.1 mg/dL (ref 8.9–10.3)
Creatinine, Ser: 0.73 mg/dL (ref 0.44–1.00)
GFR calc Af Amer: >60 mL/min (ref >60)
GFR calc non Af Amer: >60 mL/min (ref >60)
GLUCOSE: 101 mg/dL — AB (ref 65–99)
POTASSIUM: 4 mmol/L (ref 3.5–5.1)
Sodium: 138 mmol/L (ref 135–145)

## 2016-01-31 LAB — I-STAT BETA HCG BLOOD, ED (MC, WL, AP ONLY): I-stat hCG, quantitative: <5 m[IU]/mL (ref <5)

## 2016-01-31 LAB — CBC
HEMATOCRIT: 36.8 % (ref 36.0–46.0)
Hemoglobin: 12.3 g/dL (ref 12.0–15.0)
MCH: 27.2 pg (ref 26.0–34.0)
MCHC: 33.4 g/dL (ref 30.0–36.0)
MCV: 81.2 fL (ref 78.0–100.0)
Platelets: 232 10*3/uL (ref 150–400)
RBC: 4.53 MIL/uL (ref 3.87–5.11)
RDW: 13.9 % (ref 11.5–15.5)
WBC: 8.1 10*3/uL (ref 4.0–10.5)

## 2016-01-31 LAB — I-STAT TROPONIN, ED
Troponin i, poc: 0 ng/mL (ref 0.00–0.08)
Troponin i, poc: 0 ng/mL (ref 0.00–0.08)
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
## 2016-01-31 NOTE — ED Notes (Signed)
Declined W/C at D/C and was escorted to lobby by RN. 

## 2016-01-31 NOTE — ED Notes (Signed)
Pt sts left sided CP intermittent x 3 days with some radiation down left arm

## 2016-01-31 NOTE — Discharge Instructions (Signed)
There does not appear to be an emergent cause for your chest discomfort at this time. Your exam, labs, EKG and chest x-ray are all reassuring. Follow-up with your doctor next week for reevaluation. Return to ED for new or worsening symptoms as we discussed  Nonspecific Chest Pain It is often hard to find the cause of chest pain. There is always a chance that your pain could be related to something serious, such as a heart attack or a blood clot in your lungs. Chest pain can also be caused by conditions that are not life-threatening. If you have chest pain, it is very important to follow up with your doctor.  HOME CARE  If you were prescribed an antibiotic medicine, finish it all even if you start to feel better.  Avoid any activities that cause chest pain.  Do not use any tobacco products, including cigarettes, chewing tobacco, or electronic cigarettes. If you need help quitting, ask your doctor.  Do not drink alcohol.  Take medicines only as told by your doctor.  Keep all follow-up visits as told by your doctor. This is important. This includes any further testing if your chest pain does not go away.  Your doctor may tell you to keep your head raised (elevated) while you sleep.  Make lifestyle changes as told by your doctor. These may include:  Getting regular exercise. Ask your doctor to suggest some activities that are safe for you.  Eating a heart-healthy diet. Your doctor or a diet specialist (dietitian) can help you to learn healthy eating options.  Maintaining a healthy weight.  Managing diabetes, if necessary.  Reducing stress. GET HELP IF:  Your chest pain does not go away, even after treatment.  You have a rash with blisters on your chest.  You have a fever. GET HELP RIGHT AWAY IF:  Your chest pain is worse.  You have an increasing cough, or you cough up blood.  You have severe belly (abdominal) pain.  You feel extremely weak.  You pass out (faint).  You  have chills.  You have sudden, unexplained chest discomfort.  You have sudden, unexplained discomfort in your arms, back, neck, or jaw.  You have shortness of breath at any time.  You suddenly start to sweat, or your skin gets clammy.  You feel nauseous.  You vomit.  You suddenly feel light-headed or dizzy.  Your heart begins to beat quickly, or it feels like it is skipping beats. These symptoms may be an emergency. Do not wait to see if the symptoms will go away. Get medical help right away. Call your local emergency services (911 in the [place] Do not drive yourself to the hospital.   This information is not intended to replace advice given to you by your health care provider. Make sure you discuss any questions you have with your health care provider.   Document Released: 04/10/2008 Document Revised: 11/13/2014 Document Reviewed: 05/29/2014 Elsevier Interactive Patient Education [employer]2016 Elsevier Inc.

## 2016-08-06 IMAGING — US US PELVIS COMPLETE
1 series · 13 of 25 positions shown · non-contrast
Comparison: Pelvic ultrasound performed 04/06/2013, and CT of the
abdomen and pelvis from 02/19/2015

CLINICAL DATA: Bi acute onset of left lower quadrant abdominal
pain. Initial encounter.

EXAM:
TRANSABDOMINAL AND TRANSVAGINAL ULTRASOUND OF PELVIS
DOPPLER ULTRASOUND OF OVARIES
TECHNIQUE: Both transabdominal and transvaginal ultrasound examinations of the
pelvis were performed. Transabdominal technique was performed for
global imaging of the pelvis including uterus, ovaries, adnexal
regions, and pelvic cul-de-sac.
It was necessary to proceed with endovaginal exam following the
transabdominal exam to visualize the uterus and ovaries in greater
detail. Color and duplex Doppler ultrasound was utilized to evaluate
blood flow to the ovaries.

[Series 1: us pelvis complete · 0.21mm/px · 61 acquisitions, 13 frames shown]
[im 1/61]
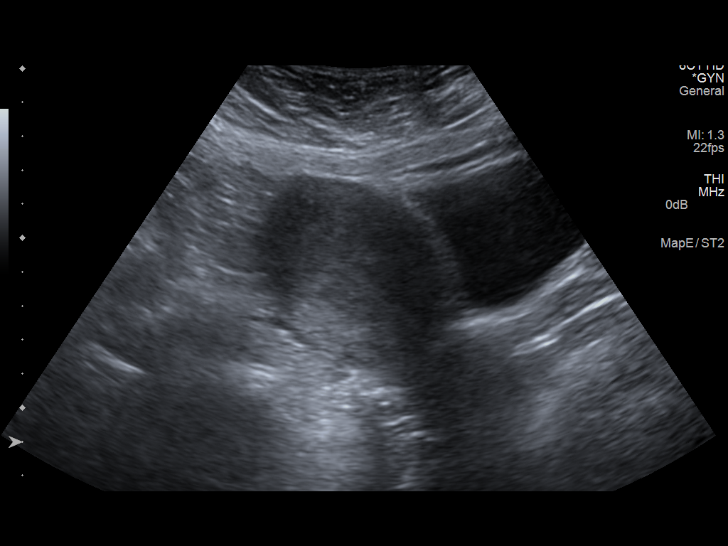
[im 6/61]
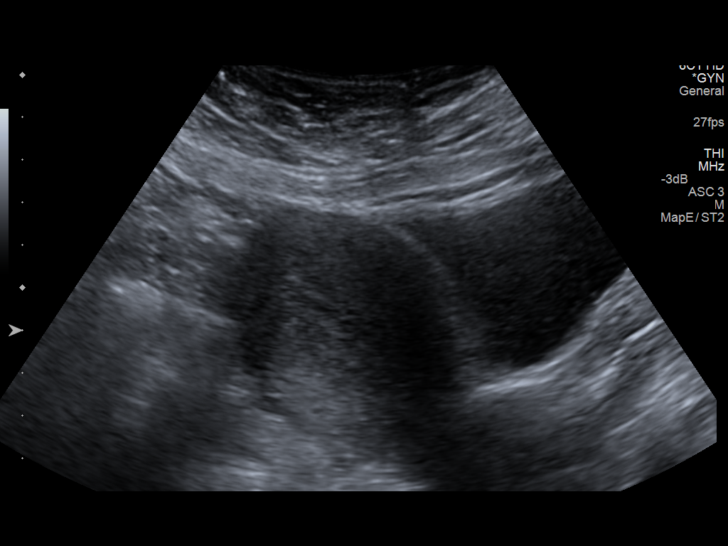
[im 11/61]
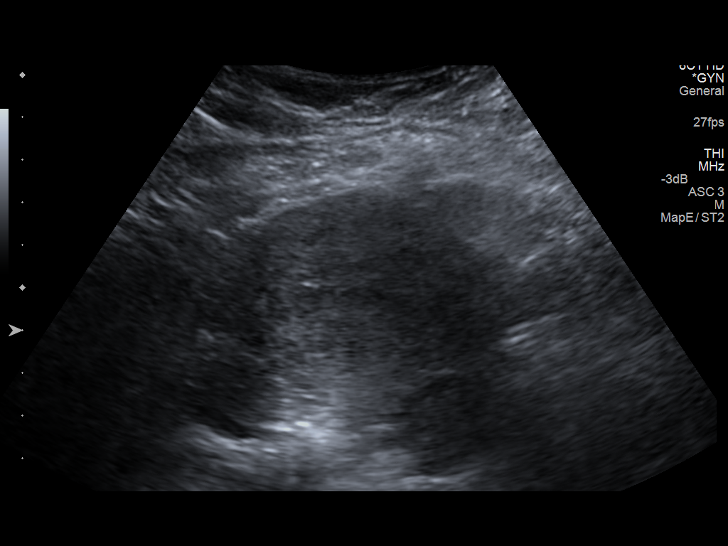
[im 16/61]
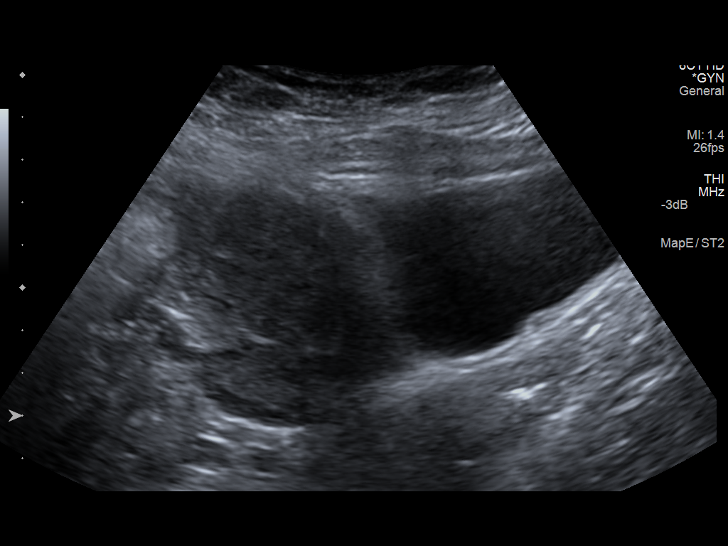
[im 21/61]
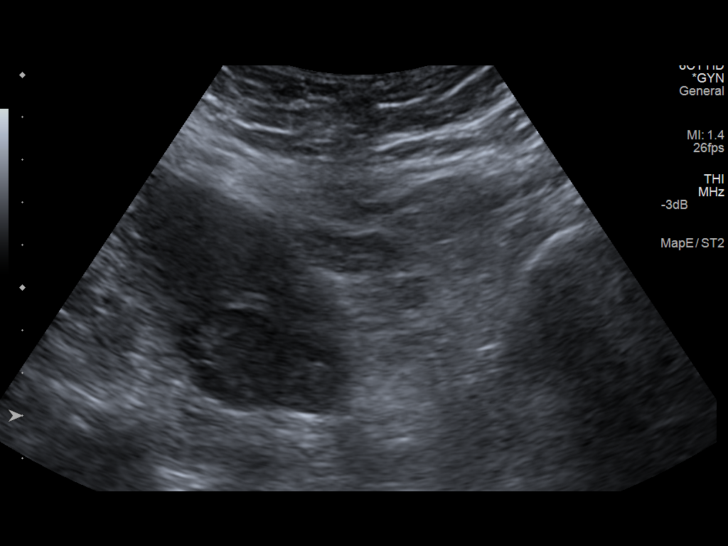
[im 26/61]
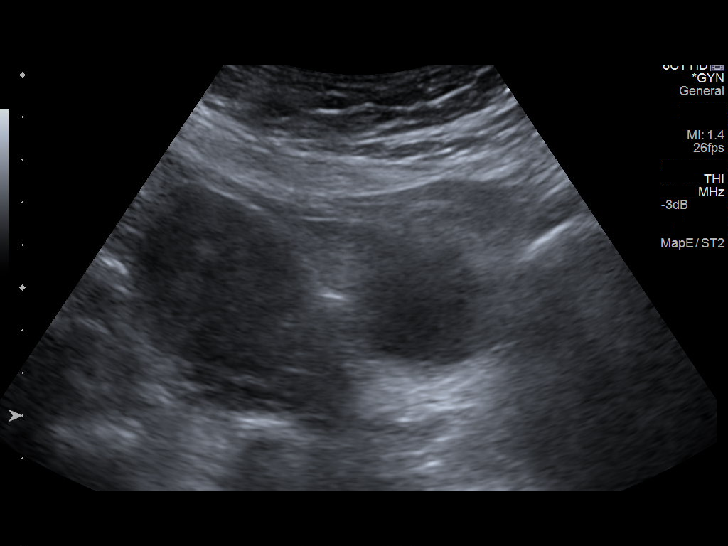
[im 31/61]
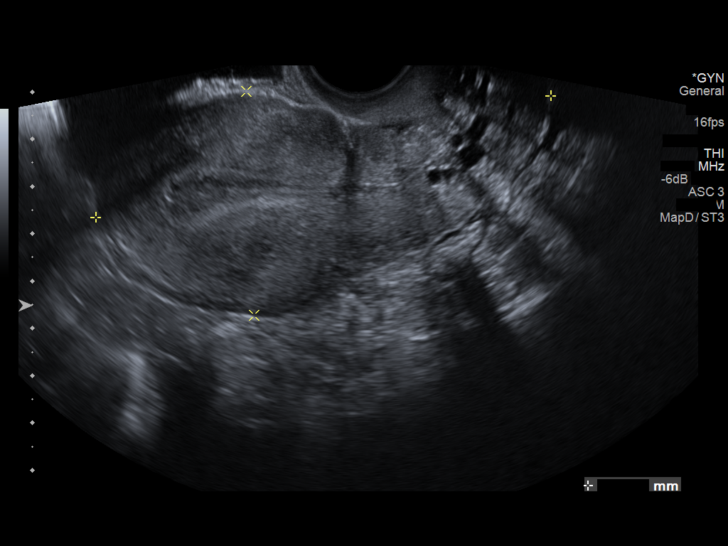
[im 36/61]
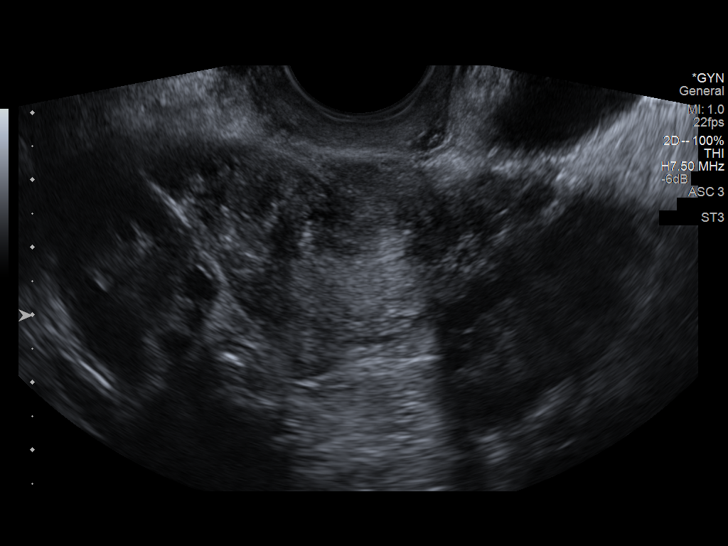
[im 41/61]
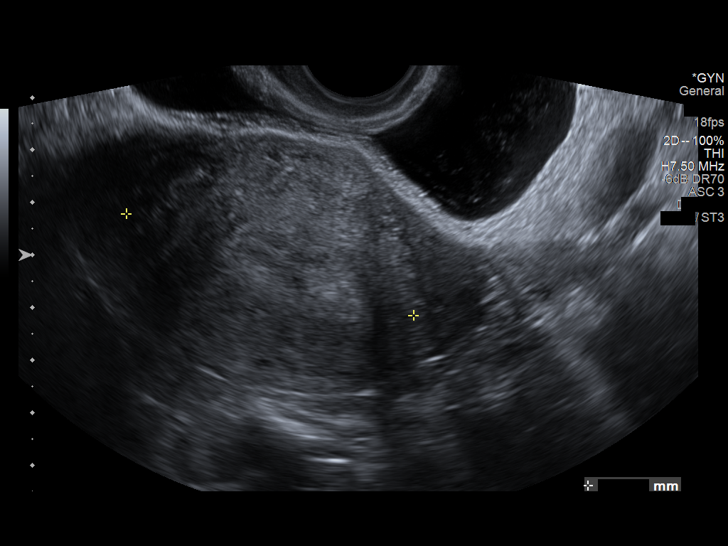
[im 46/61]
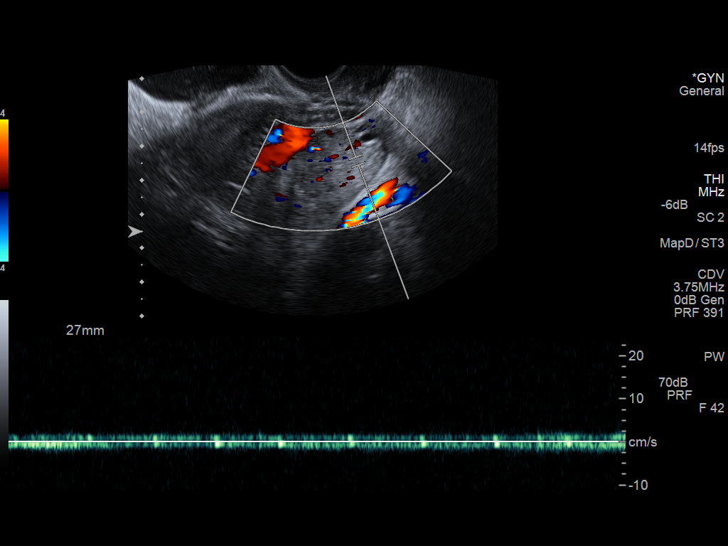
[im 51/61]
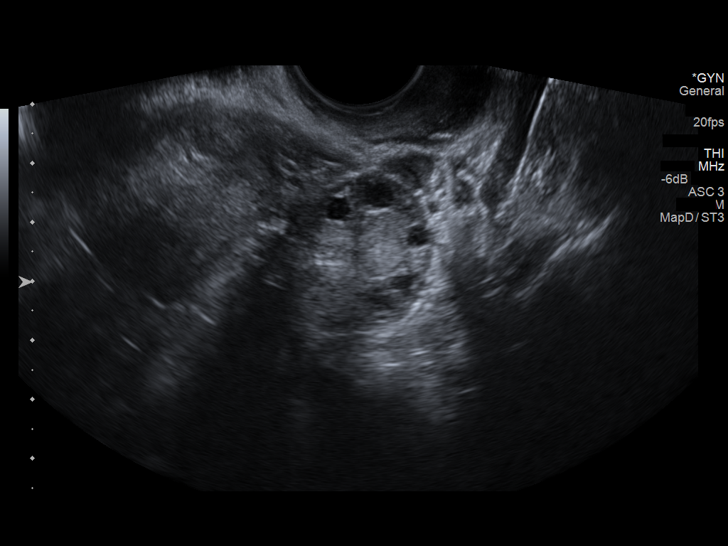
[im 56/61]
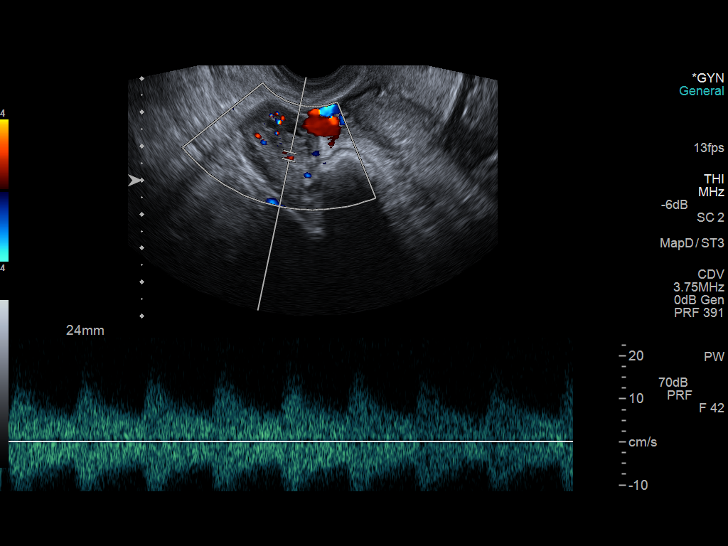
[im 61/61]
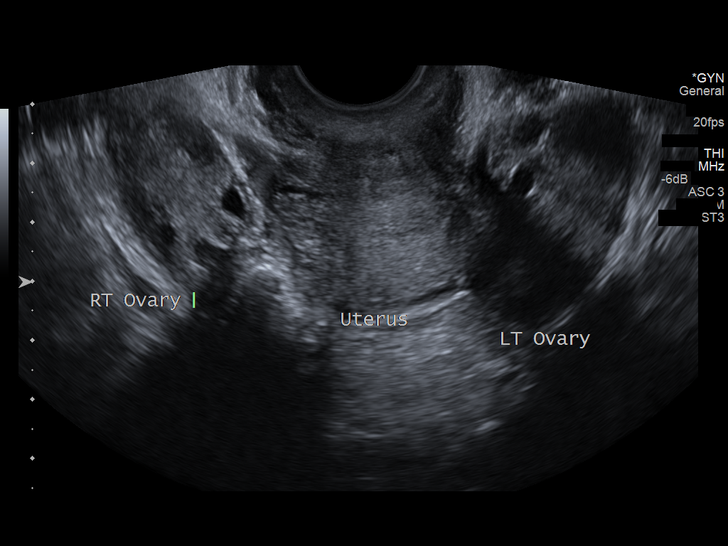

[13 of 25 positions shown; findings below may reference images not displayed]

FINDINGS: Uterus

Measurements: 9.3 x 5.2 x 6.2 cm. No fibroids or other mass
visualized. Anteverted in appearance.

Endometrium

Thickness: 1.2 cm.  No focal abnormality visualized.

Right ovary

Measurements: 3.7 x 2.4 x 2.7 cm. Normal appearance/no adnexal mass.

Left ovary

Measurements: 4.4 x 2.5 x 2.4 cm. Normal appearance/no adnexal mass.

Pulsed Doppler evaluation of both ovaries demonstrates normal
low-resistance arterial and venous waveforms.

Other findings

No free fluid is seen within the pelvic cul-de-sac.
IMPRESSION: Unremarkable pelvic ultrasound.  No evidence for ovarian torsion.

## 2017-03-07 ENCOUNTER — Ambulatory Visit: Payer: BC Managed Care – PPO | Admitting: Obstetrics and Gynecology

## 2017-04-24 ENCOUNTER — Encounter (HOSPITAL_COMMUNITY): Payer: Self-pay | Admitting: Emergency Medicine

## 2017-04-24 DIAGNOSIS — R1032 Left lower quadrant pain: Secondary | ICD-10-CM | POA: Insufficient documentation

## 2017-04-24 LAB — COMPREHENSIVE METABOLIC PANEL
ALBUMIN: 3.5 g/dL (ref 3.5–5.0)
ALT: 13 U/L — ABNORMAL LOW (ref 14–54)
ANION GAP: 5 (ref 5–15)
AST: 16 U/L (ref 15–41)
Alkaline Phosphatase: 58 U/L (ref 38–126)
BUN: 7 mg/dL (ref 6–20)
CO2: 27 mmol/L (ref 22–32)
Calcium: 8.9 mg/dL (ref 8.9–10.3)
Chloride: 106 mmol/L (ref 101–111)
Creatinine, Ser: 0.73 mg/dL (ref 0.44–1.00)
GFR calc Af Amer: >60 mL/min (ref >60)
GFR calc non Af Amer: >60 mL/min (ref >60)
GLUCOSE: 114 mg/dL — AB (ref 65–99)
POTASSIUM: 3.9 mmol/L (ref 3.5–5.1)
SODIUM: 138 mmol/L (ref 135–145)
Total Bilirubin: <0.1 mg/dL — ABNORMAL LOW (ref 0.3–1.2)
Total Protein: 6.7 g/dL (ref 6.5–8.1)

## 2017-04-24 LAB — URINALYSIS, ROUTINE W REFLEX MICROSCOPIC
Bilirubin Urine: NEGATIVE
GLUCOSE, UA: NEGATIVE mg/dL
Hgb urine dipstick: NEGATIVE
Ketones, ur: NEGATIVE mg/dL
Leukocytes, UA: NEGATIVE
Nitrite: NEGATIVE
PH: 7 (ref 5.0–8.0)
Protein, ur: NEGATIVE mg/dL
Specific Gravity, Urine: 1.02 (ref 1.005–1.030)

## 2017-04-24 LAB — CBC
HEMATOCRIT: 37 % (ref 36.0–46.0)
Hemoglobin: 11.8 g/dL — ABNORMAL LOW (ref 12.0–15.0)
MCH: 25.7 pg — AB (ref 26.0–34.0)
MCHC: 31.9 g/dL (ref 30.0–36.0)
MCV: 80.6 fL (ref 78.0–100.0)
Platelets: 266 10*3/uL (ref 150–400)
RBC: 4.59 MIL/uL (ref 3.87–5.11)
RDW: 14.2 % (ref 11.5–15.5)
WBC: 10.8 10*3/uL — ABNORMAL HIGH (ref 4.0–10.5)

## 2017-04-24 LAB — LIPASE, BLOOD: Lipase: 22 U/L (ref 11–51)

## 2017-04-24 NOTE — ED Triage Notes (Signed)
Patient with left groin, lower abdominal pain.  Patient states that it has been going on since Sunday.  Patient does have lower back pain, denies any pain with urination.  No nausea or vomiting.

## 2017-04-24 NOTE — ED Notes (Signed)
Called for triage x2

## 2017-04-25 ENCOUNTER — Emergency Department (HOSPITAL_COMMUNITY)
Admission: EM | Admit: 2017-04-25 | Discharge: 2017-04-25 | Disposition: A | Discharge status: Home / Self Care | Payer: Self-pay | Attending: Emergency Medicine | Admitting: Emergency Medicine

## 2017-04-25 ENCOUNTER — Emergency Department (HOSPITAL_COMMUNITY): Payer: Self-pay

## 2017-04-25 DIAGNOSIS — R109 Unspecified abdominal pain: Secondary | ICD-10-CM

## 2017-04-25 LAB — I-STAT BETA HCG BLOOD, ED (MC, WL, AP ONLY): I-stat hCG, quantitative: <5 m[IU]/mL (ref <5)

## 2017-04-25 MED ORDER — ONDANSETRON 4 MG PO TBDP: 4.0000 mg | ORAL_TABLET | Freq: Three times a day (TID) | ORAL | 0 refills | Status: AC | PRN

## 2017-04-25 MED ORDER — SODIUM CHLORIDE 0.9 % IV BOLUS (SEPSIS)
1000.0000 mL | Freq: Once | INTRAVENOUS | Status: AC
  Administered 2017-04-25: 1000 mL via INTRAVENOUS

## 2017-04-25 MED ORDER — HYDROCODONE-ACETAMINOPHEN 5-325 MG PO TABS: 1.0000 | ORAL_TABLET | Freq: Four times a day (QID) | ORAL | 0 refills | Status: AC | PRN

## 2017-04-25 MED ORDER — KETOROLAC TROMETHAMINE 15 MG/ML IJ SOLN
15.0000 mg | Freq: Once | INTRAMUSCULAR | Status: AC
  Administered 2017-04-25: 15 mg via INTRAVENOUS
  Filled 2017-04-25: qty 1

## 2017-04-25 NOTE — Discharge Instructions (Signed)
You were seen today for left-sided abdominal pain and flank pain. Your workup is reassuring. This may be related to a kidney stone that is not visualized. Supportive measures at home. Follow-up with urology. If you have any new or worsening symptoms she should be reevaluated.

## 2017-04-25 NOTE — ED Provider Notes (Signed)
[facility]-EM[facility]ENCY DEPT Provider Note   CSN: [id number] Arrival date & time: 04/24/17  2020  By signing my name below, I, Claudia Fitzgerald, attest that this documentation has been prepared under the direction and in the presence of Parish Augustine, Mayer Masker, MD. Electronically Signed: Karren Cobble, ED Scribe. 04/25/17. 2:57 AM.  History   Chief Complaint Chief Complaint  Patient presents with  . Abdominal Pain   The history is provided by the patient. No language interpreter was used.    HPI Comments: Claudia Fitzgerald is a 31 y.o. female with a history of nephrolithiasis and anxiety who presents to the Emergency Department complaining of intermittent, sharp radiating left lower quadrant abdominal pain that began three days ago. Pt notes associated vaginal discharge. She notes three days ago she began to experience left lower quadrant pain that intermittently radiates up. Notes alleviation when lying in the fetal position. Reports a history of similar episode that was diagnosed as nephrolithiasis. Denies chance of pregnancy. No concerns of STD, but states she would be interested in receiving testing. Denies diarrhea, nausea, emesis, hematuria, or dysuria.   Past Medical History:  Diagnosis Date  . Anxiety    no meds  . Herpes   . Kidney stones   . Termination of pregnancy    x 1   There are no active problems to display for this patient.  Past Surgical History:  Procedure Laterality Date  . CESAREAN SECTION  2008 and 2012  . CESAREAN SECTION WITH BILATERAL TUBAL LIGATION N/A 05/08/2013   Procedure: REPEAT CESAREAN SECTION WITH BILATERAL TUBAL LIGATION;  Surgeon: Philip Aspen, DO;  Location: [facility];  Service: Obstetrics;  Laterality: N/A;   OB History    Gravida Para Term Preterm AB Living   4 3 2 1 1 3    SAB TAB Ectopic Multiple Live Births   0 1 0 0 3     Home Medications    Prior to Admission medications   Medication Sig Start Date End Date Taking? Authorizing Provider  doxycycline  (VIBRAMYCIN) 100 MG capsule Take 1 capsule (100 mg total) by mouth 2 (two) times daily. Patient not taking: Reported on 04/25/2017 09/27/15   Muthersbaugh, Dahlia Client, PA-C  HYDROcodone-acetaminophen (NORCO/VICODIN) 5-325 MG tablet Take 1 tablet by mouth every 6 (six) hours as needed. 04/25/17   Athalee Esterline, Mayer Masker, MD  ondansetron (ZOFRAN ODT) 4 MG disintegrating tablet Take 1 tablet (4 mg total) by mouth every 8 (eight) hours as needed for nausea or vomiting. 04/25/17   Karne Ozga, Mayer Masker, MD    Family History No family history on file.  Social History Social History  Substance Use Topics  . Smoking status: Never Smoker  . Smokeless tobacco: Never Used  . Alcohol use Yes     Comment: socially but none with pregnancy    Allergies   Patient has no known allergies.   Review of Systems Review of Systems  Constitutional: Negative for fever.  Gastrointestinal: Positive for abdominal pain. Negative for diarrhea, nausea and vomiting.  Genitourinary: Negative for dysuria, hematuria and vaginal discharge.  All other systems reviewed and are negative.  Physical Exam Updated Vital Signs BP 106/66 (BP Location: Left Arm)   Pulse 76   Temp 99.2 F (37.3 C) (Oral)   Resp 16   SpO2 100%   Physical Exam  Constitutional: She is oriented to person, place, and time. She appears well-developed and well-nourished. No distress.  Overweight  HENT:  Head: Normocephalic and atraumatic.  Cardiovascular: Normal rate, regular  rhythm and normal heart sounds.   Pulmonary/Chest: Effort normal and breath sounds normal. No respiratory distress. She has no wheezes.  Abdominal: Soft. Bowel sounds are normal. There is no tenderness. There is no guarding.  No left lower quadrant tenderness to palpation, no CVA tenderness  Neurological: She is alert and oriented to person, place, and time.  Skin: Skin is warm and dry.  Psychiatric: She has a normal mood and affect.  Nursing note and vitals reviewed.    ED  Treatments / Results  DIAGNOSTIC STUDIES: Oxygen Saturation is 98% on RA, normal by my interpretation.   COORDINATION OF CARE: 2:33 AM-Discussed next steps with pt. Pt verbalized understanding and is agreeable with the plan.   Labs (all labs ordered are listed, but only abnormal results are displayed) Labs Reviewed  COMPREHENSIVE METABOLIC PANEL - Abnormal; Notable for the following:       Result Value   Glucose, Bld 114 (*)    ALT 13 (*)    Total Bilirubin <0.1 (*)    All other components within normal limits  CBC - Abnormal; Notable for the following:    WBC 10.8 (*)    Hemoglobin 11.8 (*)    MCH 25.7 (*)    All other components within normal limits  LIPASE, BLOOD  URINALYSIS, ROUTINE W REFLEX MICROSCOPIC  I-STAT BETA HCG BLOOD, ED ([facility], [facility], [facility] ONLY)    EKG  EKG Interpretation None       Radiology Dg Abdomen 1 View  Result Date: 04/25/2017 CLINICAL DATA:  31 year old female with left lower quadrant abdominal pain. EXAM: ABDOMEN - 1 VIEW COMPARISON:  CT of the abdomen pelvis dated 02/19/2015 FINDINGS: Nondistended air-filled loops of bowel. No bowel dilatation or evidence of obstruction. No free air noted on the provided image. No radiopaque calculi. Bilateral tubal ligation clips. The osseous structures and soft tissues appear unremarkable. IMPRESSION: Negative. Electronically Signed   By: Elgie CollardArash  Radparvar M.D.   On: 04/25/2017 03:14   [place] Renal  Result Date: 04/25/2017 CLINICAL DATA:  Left lower quadrant pain. EXAM: RENAL / URINARY TRACT ULTRASOUND COMPLETE COMPARISON:  CT 02/19/2015 FINDINGS: Right Kidney: Length: 10.5 cm. Echogenicity within normal limits. No mass or hydronephrosis visualized. No shadowing calculi. Left Kidney: Length: 11.2 cm. Echogenicity within normal limits. No mass or hydronephrosis visualized. No shadowing calculi. Bladder: Appears normal for degree of bladder distention. IMPRESSION: Unremarkable renal ultrasound.  No hydronephrosis. Electronically  Signed   By: Rubye OaksMelanie  Ehinger M.D.   On: 04/25/2017 04:10    Procedures Procedures (including critical care time)  Medications Ordered in ED Medications  ketorolac (TORADOL) 15 MG/ML injection 15 mg (15 mg Intravenous Given 04/25/17 0255)  sodium chloride 0.9 % bolus 1,000 mL (0 mLs Intravenous Stopped 04/25/17 0410)    Initial Impression / Assessment and Plan / ED Course  I have reviewed the triage vital signs and the nursing notes.  Pertinent labs & imaging results that were available during my care of the patient were reviewed by me and considered in my medical decision making (see chart for details).     Patient presents with left-sided abdominal flank pain that comes and goes of the last several days. Similar to prior kidney stones. She is nontoxic. Afebrile. Vital signs reassuring. No reproducible pain on exam. Patient was given fluids and Toradol. She appears comfortable.  KUB and ultrasound reassuring. Workup is reassuring. On recheck, patient is comfortable. We'll treat as a kidney stone. Doubt other intra-abdominal pathology as she has no reproducible  pain.  After history, exam, and medical workup I feel the patient has been appropriately medically screened and is safe for discharge home. Pertinent diagnoses were discussed with the patient. Patient was given return precautions.   Final Clinical Impressions(s) / ED Diagnoses   Final diagnoses:  Left flank pain    New Prescriptions New Prescriptions   HYDROCODONE-ACETAMINOPHEN (NORCO/VICODIN) 5-325 MG TABLET    Take 1 tablet by mouth every 6 (six) hours as needed.   ONDANSETRON (ZOFRAN ODT) 4 MG DISINTEGRATING TABLET    Take 1 tablet (4 mg total) by mouth every 8 (eight) hours as needed for nausea or vomiting.   I personally performed the services described in this documentation, which was scribed in my presence. The recorded information has been reviewed and is accurate.     Shon Baton, MD 04/25/17 [phone]

## 2019-01-06 IMAGING — CR DG ABDOMEN 1V
1 series · 1 of 1 positions shown · non-contrast
Comparison: CT of the abdomen pelvis dated 02/19/2015

CLINICAL DATA: 31-year-old female with left lower quadrant
abdominal pain.

EXAM:
ABDOMEN - 1 VIEW

[abdomen kub]
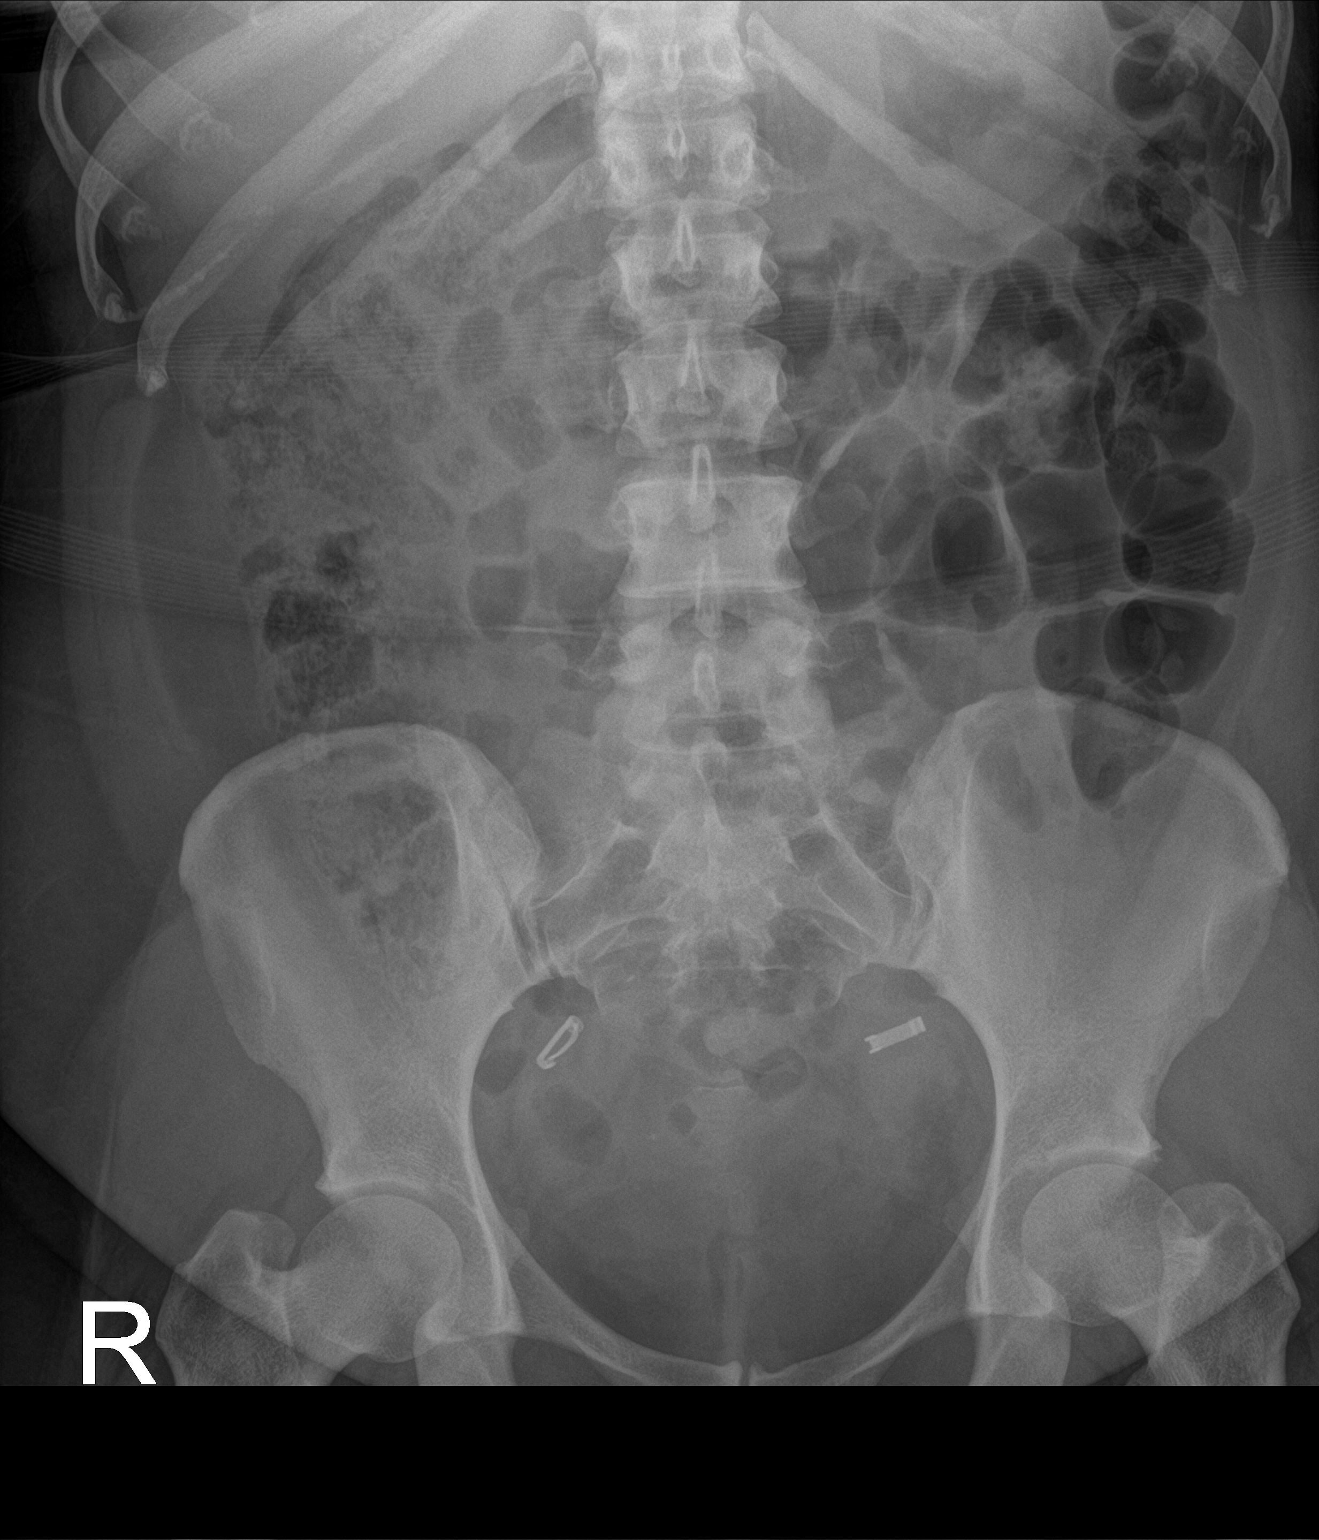

[1 of 1 positions shown; findings below may reference images not displayed]

FINDINGS: Nondistended air-filled loops of bowel. No bowel dilatation or
evidence of obstruction. No free air noted on the provided image. No
radiopaque calculi. Bilateral tubal ligation clips. The osseous
structures and soft tissues appear unremarkable.
IMPRESSION: Negative.
